# Patient Record
Sex: Male | Born: 1970 | Smoking: Never smoker
Health system: Southern US, Community
[De-identification: ages and names within clinical notes are randomized; demographics above are authoritative.]

## PROBLEM LIST (undated history)

## (undated) DIAGNOSIS — E119 Type 2 diabetes mellitus without complications: Secondary | ICD-10-CM

---

## 2015-05-30 ENCOUNTER — Encounter: Payer: Self-pay | Admitting: Physician Assistant

## 2015-05-30 ENCOUNTER — Ambulatory Visit: Payer: Self-pay | Admitting: Physician Assistant

## 2015-05-30 VITALS — BP 130/83 | HR 86 | Temp 98.3°F

## 2015-05-30 DIAGNOSIS — J069 Acute upper respiratory infection, unspecified: Secondary | ICD-10-CM

## 2015-05-30 MED ORDER — AZITHROMYCIN 250 MG PO TABS: ORAL_TABLET | ORAL | Status: DC

## 2015-05-30 MED ORDER — METHYLPREDNISOLONE 4 MG PO TBPK: ORAL_TABLET | ORAL | Status: DC

## 2015-05-30 NOTE — Progress Notes (Signed)
S: C/o runny nose and congestion for 3 days, ? fever, chills, denies cp/sob, v/d; mucus was green this am but white throughout the day, cough is sporadic, has laryngitis which makes it hard to talk to pts  Using otc meds without relief  O: PE: vitals wnl, nad,  Pt feels clammy, perrl eomi, normocephalic, tms dull, nasal mucosa red and swollen, throat injected, neck supple no lymph, voice is hoarse, lungs c t a, cv rrr, neuro intact  A:  Acute uri   P: medrol dose pack, zpack, drink fluids, continue regular meds , use otc meds of choice, return if not improving in 5 days, return earlier if worsening

## 2017-01-02 ENCOUNTER — Encounter (INDEPENDENT_AMBULATORY_CARE_PROVIDER_SITE_OTHER): Payer: Self-pay

## 2017-01-02 ENCOUNTER — Encounter: Payer: Self-pay | Admitting: Physician Assistant

## 2017-01-02 ENCOUNTER — Ambulatory Visit: Payer: Self-pay | Admitting: Physician Assistant

## 2017-01-02 VITALS — BP 110/79 | HR 106 | Temp 98.5°F | Resp 15 | Ht 71.0 in | Wt 147.0 lb

## 2017-01-02 DIAGNOSIS — Z008 Encounter for other general examination: Secondary | ICD-10-CM

## 2017-01-02 DIAGNOSIS — Z0189 Encounter for other specified special examinations: Secondary | ICD-10-CM

## 2017-01-02 DIAGNOSIS — Z0000 Encounter for general adult medical examination without abnormal findings: Secondary | ICD-10-CM

## 2017-01-02 NOTE — Progress Notes (Signed)
S: pt here for wellness physical and biometrics for insurance purposes, no complaints ros neg. PMH:  neg  Social:  Neg Fam: dm htn  O: vitals wnl, nad, ENT wnl, neck supple no lymph, lungs c t a, cv rrr, abd soft nontender bs normal all 4 quads  A: wellness, biometric physical  P: labs today, f/u in november

## 2017-01-03 LAB — CMP12+LP+TP+TSH+6AC+PSA+CBC…
A/G RATIO: 2.1 (ref 1.2–2.2)
ALBUMIN: 4.6 g/dL (ref 3.5–5.5)
ALT: 18 IU/L (ref 0–44)
AST: 11 IU/L (ref 0–40)
Alkaline Phosphatase: 85 IU/L (ref 39–117)
BASOS: 1 %
BUN/Creatinine Ratio: 11 (ref 9–20)
BUN: 9 mg/dL (ref 6–24)
Basophils Absolute: 0 10*3/uL (ref 0.0–0.2)
Bilirubin Total: 0.6 mg/dL (ref 0.0–1.2)
CALCIUM: 9.2 mg/dL (ref 8.7–10.2)
CHOLESTEROL TOTAL: 219 mg/dL — AB (ref 100–199)
Chloride: 98 mmol/L (ref 96–106)
Chol/HDL Ratio: 4.7 ratio (ref 0.0–5.0)
Creatinine, Ser: 0.84 mg/dL (ref 0.76–1.27)
EOS (ABSOLUTE): 0 10*3/uL (ref 0.0–0.4)
Eos: 1 %
Estimated CHD Risk: 0.9 times avg. (ref 0.0–1.0)
FREE THYROXINE INDEX: 3.1 (ref 1.2–4.9)
GFR calc Af Amer: 121 mL/min/{1.73_m2} (ref >59)
GFR calc non Af Amer: 105 mL/min/{1.73_m2} (ref >59)
GGT: 57 IU/L (ref 0–65)
GLOBULIN, TOTAL: 2.2 g/dL (ref 1.5–4.5)
Glucose: 297 mg/dL — ABNORMAL HIGH (ref 65–99)
HDL: 47 mg/dL (ref >39)
Hematocrit: 44.8 % (ref 37.5–51.0)
Hemoglobin: 14.8 g/dL (ref 13.0–17.7)
IMMATURE GRANULOCYTES: 0 %
Immature Grans (Abs): 0 10*3/uL (ref 0.0–0.1)
Iron: 105 ug/dL (ref 38–169)
LDH: 158 IU/L (ref 121–224)
LDL Calculated: 158 mg/dL — ABNORMAL HIGH (ref 0–99)
LYMPHS ABS: 1.8 10*3/uL (ref 0.7–3.1)
Lymphs: 32 %
MCH: 27.5 pg (ref 26.6–33.0)
MCHC: 33 g/dL (ref 31.5–35.7)
MCV: 83 fL (ref 79–97)
MONOS ABS: 0.5 10*3/uL (ref 0.1–0.9)
Monocytes: 9 %
NEUTROS PCT: 57 %
Neutrophils Absolute: 3.2 10*3/uL (ref 1.4–7.0)
PHOSPHORUS: 3 mg/dL (ref 2.5–4.5)
PLATELETS: 299 10*3/uL (ref 150–379)
PROSTATE SPECIFIC AG, SERUM: 0.4 ng/mL (ref 0.0–4.0)
Potassium: 4.2 mmol/L (ref 3.5–5.2)
RBC: 5.38 x10E6/uL (ref 4.14–5.80)
RDW: 13.3 % (ref 12.3–15.4)
SODIUM: 137 mmol/L (ref 134–144)
T3 UPTAKE RATIO: 33 % (ref 24–39)
T4 TOTAL: 9.4 ug/dL (ref 4.5–12.0)
TRIGLYCERIDES: 69 mg/dL (ref 0–149)
TSH: 0.872 u[IU]/mL (ref 0.450–4.500)
Total Protein: 6.8 g/dL (ref 6.0–8.5)
Uric Acid: 3 mg/dL — ABNORMAL LOW (ref 3.7–8.6)
VLDL Cholesterol Cal: 14 mg/dL (ref 5–40)
WBC: 5.6 10*3/uL (ref 3.4–10.8)

## 2017-01-03 LAB — HIV ANTIBODY (ROUTINE TESTING W REFLEX): HIV Screen 4th Generation wRfx: NONREACTIVE

## 2017-01-03 LAB — HEPATITIS C ANTIBODY (REFLEX): HCV Ab: 0.1 s/co ratio (ref 0.0–0.9)

## 2017-01-03 LAB — HEMOGLOBIN A1C
ESTIMATED AVERAGE GLUCOSE: 335 mg/dL
HEMOGLOBIN A1C: 13.3 % — AB (ref 4.8–5.6)

## 2017-01-03 LAB — HCV COMMENT:

## 2017-01-03 LAB — VITAMIN D 25 HYDROXY (VIT D DEFICIENCY, FRACTURES): Vit D, 25-Hydroxy: 28.5 ng/mL — ABNORMAL LOW (ref 30.0–100.0)

## 2017-01-03 MED ORDER — BLOOD GLUCOSE TEST VI STRP: ORAL_STRIP | 12 refills | Status: AC

## 2017-01-03 MED ORDER — METFORMIN HCL 1000 MG PO TABS: 1000.0000 mg | ORAL_TABLET | Freq: Two times a day (BID) | ORAL | 3 refills | Status: AC

## 2017-01-03 MED ORDER — LANCETS THIN MISC: 12 refills | Status: AC

## 2017-01-03 NOTE — Addendum Note (Signed)
Addended by: Faythe GheeFISHER, SUSAN W on: 01/03/2017 03:35 PM   Modules accepted: Orders

## 2017-01-23 ENCOUNTER — Telehealth: Payer: Self-pay | Admitting: Emergency Medicine

## 2017-01-23 NOTE — Telephone Encounter (Signed)
Per Patrick Barry's authorization I called in a refill for test strips for the One Touch Verio meter in to [facility] in [place].

## 2017-04-24 ENCOUNTER — Other Ambulatory Visit: Payer: Self-pay

## 2017-04-24 DIAGNOSIS — Z299 Encounter for prophylactic measures, unspecified: Secondary | ICD-10-CM

## 2017-04-24 NOTE — Progress Notes (Signed)
Patient came in to have blood drawn for testing per Dr. Solum's orders. 

## 2017-04-25 LAB — COMPREHENSIVE METABOLIC PANEL
A/G RATIO: 2.3 — AB (ref 1.2–2.2)
ALT: 7 IU/L (ref 0–44)
AST: 9 IU/L (ref 0–40)
Albumin: 4.6 g/dL (ref 3.5–5.5)
Alkaline Phosphatase: 53 IU/L (ref 39–117)
BUN/Creatinine Ratio: 15 (ref 9–20)
BUN: 11 mg/dL (ref 6–24)
Bilirubin Total: 0.3 mg/dL (ref 0.0–1.2)
CO2: 21 mmol/L (ref 20–29)
CREATININE: 0.71 mg/dL — AB (ref 0.76–1.27)
Calcium: 9.1 mg/dL (ref 8.7–10.2)
Chloride: 98 mmol/L (ref 96–106)
GFR calc Af Amer: 130 mL/min/{1.73_m2} (ref >59)
GFR, EST NON AFRICAN AMERICAN: 113 mL/min/{1.73_m2} (ref >59)
Globulin, Total: 2 g/dL (ref 1.5–4.5)
Glucose: 122 mg/dL — ABNORMAL HIGH (ref 65–99)
POTASSIUM: 4.1 mmol/L (ref 3.5–5.2)
Sodium: 140 mmol/L (ref 134–144)
Total Protein: 6.6 g/dL (ref 6.0–8.5)

## 2017-04-25 LAB — LIPID PANEL
CHOL/HDL RATIO: 3.7 ratio (ref 0.0–5.0)
Cholesterol, Total: 159 mg/dL (ref 100–199)
HDL: 43 mg/dL (ref >39)
LDL CALC: 105 mg/dL — AB (ref 0–99)
Triglycerides: 55 mg/dL (ref 0–149)
VLDL Cholesterol Cal: 11 mg/dL (ref 5–40)

## 2017-04-25 LAB — HGB A1C W/O EAG: Hgb A1c MFr Bld: 7.2 % — ABNORMAL HIGH (ref 4.8–5.6)

## 2017-04-25 LAB — MICROALBUMIN / CREATININE URINE RATIO
CREATININE, UR: 137.9 mg/dL
Microalb/Creat Ratio: 9.9 mg/g creat (ref 0.0–30.0)
Microalbumin, Urine: 13.6 ug/mL

## 2017-04-25 LAB — VITAMIN D 25 HYDROXY (VIT D DEFICIENCY, FRACTURES): VIT D 25 HYDROXY: 42.8 ng/mL (ref 30.0–100.0)

## 2017-05-24 ENCOUNTER — Encounter: Payer: Self-pay | Admitting: Emergency Medicine

## 2017-05-24 ENCOUNTER — Ambulatory Visit: Payer: Self-pay | Admitting: Physician Assistant

## 2017-05-24 ENCOUNTER — Observation Stay: Payer: Managed Care, Other (non HMO) | Admitting: Anesthesiology

## 2017-05-24 ENCOUNTER — Encounter: Payer: Self-pay | Admitting: Physician Assistant

## 2017-05-24 ENCOUNTER — Encounter: Payer: Self-pay | Admitting: Anesthesiology

## 2017-05-24 ENCOUNTER — Emergency Department: Payer: Managed Care, Other (non HMO)

## 2017-05-24 ENCOUNTER — Observation Stay
Admission: EM | Admit: 2017-05-24 | Discharge: 2017-05-25 | Disposition: A | Discharge status: Home / Self Care | Payer: Managed Care, Other (non HMO) | Attending: Surgery | Admitting: Surgery

## 2017-05-24 ENCOUNTER — Other Ambulatory Visit: Payer: Self-pay

## 2017-05-24 ENCOUNTER — Encounter
Admission: EM | Disposition: A | Discharge status: Home / Self Care | Payer: Self-pay | Source: Home / Self Care | Attending: Emergency Medicine

## 2017-05-24 VITALS — BP 110/79 | HR 106 | Temp 98.5°F | Resp 16

## 2017-05-24 DIAGNOSIS — K353 Acute appendicitis with localized peritonitis, without perforation or gangrene: Secondary | ICD-10-CM

## 2017-05-24 DIAGNOSIS — Z833 Family history of diabetes mellitus: Secondary | ICD-10-CM | POA: Diagnosis not present

## 2017-05-24 DIAGNOSIS — Z7984 Long term (current) use of oral hypoglycemic drugs: Secondary | ICD-10-CM | POA: Insufficient documentation

## 2017-05-24 DIAGNOSIS — R1031 Right lower quadrant pain: Secondary | ICD-10-CM

## 2017-05-24 DIAGNOSIS — K3533 Acute appendicitis with perforation and localized peritonitis, with abscess: Secondary | ICD-10-CM | POA: Diagnosis not present

## 2017-05-24 DIAGNOSIS — K3580 Unspecified acute appendicitis: Secondary | ICD-10-CM | POA: Diagnosis present

## 2017-05-24 DIAGNOSIS — Z8249 Family history of ischemic heart disease and other diseases of the circulatory system: Secondary | ICD-10-CM | POA: Diagnosis not present

## 2017-05-24 DIAGNOSIS — N281 Cyst of kidney, acquired: Secondary | ICD-10-CM | POA: Insufficient documentation

## 2017-05-24 DIAGNOSIS — R109 Unspecified abdominal pain: Secondary | ICD-10-CM

## 2017-05-24 DIAGNOSIS — Z79899 Other long term (current) drug therapy: Secondary | ICD-10-CM | POA: Insufficient documentation

## 2017-05-24 DIAGNOSIS — Z7982 Long term (current) use of aspirin: Secondary | ICD-10-CM | POA: Insufficient documentation

## 2017-05-24 DIAGNOSIS — E119 Type 2 diabetes mellitus without complications: Secondary | ICD-10-CM | POA: Insufficient documentation

## 2017-05-24 HISTORY — DX: Type 2 diabetes mellitus without complications: E11.9

## 2017-05-24 HISTORY — PX: LAPAROSCOPIC APPENDECTOMY: SHX408

## 2017-05-24 LAB — GLUCOSE, CAPILLARY
GLUCOSE-CAPILLARY: 118 mg/dL — AB (ref 65–99)
Glucose-Capillary: 157 mg/dL — ABNORMAL HIGH (ref 65–99)

## 2017-05-24 LAB — CBC
HCT: 45.7 % (ref 40.0–52.0)
Hemoglobin: 15.2 g/dL (ref 13.0–18.0)
MCH: 28.2 pg (ref 26.0–34.0)
MCHC: 33.1 g/dL (ref 32.0–36.0)
MCV: 85.2 fL (ref 80.0–100.0)
Platelets: 337 K/uL (ref 150–440)
RBC: 5.37 MIL/uL (ref 4.40–5.90)
RDW: 14.1 % (ref 11.5–14.5)
WBC: 6.5 K/uL (ref 3.8–10.6)

## 2017-05-24 LAB — POCT URINALYSIS DIPSTICK
BILIRUBIN UA: NEGATIVE
Blood, UA: NEGATIVE
Glucose, UA: NEGATIVE
Ketones, UA: NEGATIVE
LEUKOCYTES UA: NEGATIVE
NITRITE UA: NEGATIVE
PH UA: 5.5 (ref 5.0–8.0)
PROTEIN UA: NEGATIVE
Spec Grav, UA: 1.015 (ref 1.010–1.025)
UROBILINOGEN UA: 0.2 U/dL

## 2017-05-24 LAB — URINALYSIS, COMPLETE (UACMP) WITH MICROSCOPIC
Bacteria, UA: NONE SEEN
Bilirubin Urine: NEGATIVE
GLUCOSE, UA: NEGATIVE mg/dL
HGB URINE DIPSTICK: NEGATIVE
Ketones, ur: NEGATIVE mg/dL
Leukocytes, UA: NEGATIVE
NITRITE: NEGATIVE
Protein, ur: NEGATIVE mg/dL
SPECIFIC GRAVITY, URINE: 1.042 — AB (ref 1.005–1.030)
Squamous Epithelial / LPF: NONE SEEN
pH: 7 (ref 5.0–8.0)

## 2017-05-24 LAB — COMPREHENSIVE METABOLIC PANEL
ALT: 19 U/L (ref 17–63)
AST: 20 U/L (ref 15–41)
Albumin: 4.7 g/dL (ref 3.5–5.0)
Alkaline Phosphatase: 65 U/L (ref 38–126)
Anion gap: 11 (ref 5–15)
BUN: 7 mg/dL (ref 6–20)
CHLORIDE: 99 mmol/L — AB (ref 101–111)
CO2: 27 mmol/L (ref 22–32)
CREATININE: 0.68 mg/dL (ref 0.61–1.24)
Calcium: 9.1 mg/dL (ref 8.9–10.3)
Glucose, Bld: 171 mg/dL — ABNORMAL HIGH (ref 65–99)
POTASSIUM: 4.3 mmol/L (ref 3.5–5.1)
SODIUM: 137 mmol/L (ref 135–145)
Total Bilirubin: 0.7 mg/dL (ref 0.3–1.2)
Total Protein: 8.6 g/dL — ABNORMAL HIGH (ref 6.5–8.1)

## 2017-05-24 LAB — LIPASE, BLOOD: Lipase: 34 U/L (ref 11–51)

## 2017-05-24 LAB — SURGICAL PCR SCREEN
MRSA, PCR: NEGATIVE
STAPHYLOCOCCUS AUREUS: NEGATIVE

## 2017-05-24 SURGERY — APPENDECTOMY, LAPAROSCOPIC
Anesthesia: General | Site: Abdomen | Wound class: Clean Contaminated

## 2017-05-24 MED ORDER — ONDANSETRON HCL 4 MG/2ML IJ SOLN: 4.0000 mg | Freq: Once | INTRAMUSCULAR | Status: DC | PRN

## 2017-05-24 MED ORDER — HEPARIN SODIUM (PORCINE) 5000 UNIT/ML IJ SOLN
5000.0000 [IU] | Freq: Three times a day (TID) | INTRAMUSCULAR | Status: DC
  Administered 2017-05-24 – 2017-05-25 (×2): 5000 [IU] via SUBCUTANEOUS
  Filled 2017-05-24 (×2): qty 1

## 2017-05-24 MED ORDER — SODIUM CHLORIDE 0.9 % IV BOLUS (SEPSIS)
1000.0000 mL | Freq: Once | INTRAVENOUS | Status: AC
  Administered 2017-05-24: 1000 mL via INTRAVENOUS

## 2017-05-24 MED ORDER — PROPOFOL 10 MG/ML IV BOLUS
INTRAVENOUS | Status: AC
  Filled 2017-05-24: qty 20

## 2017-05-24 MED ORDER — LIDOCAINE HCL (PF) 1 % IJ SOLN
INTRAMUSCULAR | Status: AC
  Filled 2017-05-24: qty 30

## 2017-05-24 MED ORDER — LIDOCAINE HCL (CARDIAC) 20 MG/ML IV SOLN
INTRAVENOUS | Status: DC | PRN
  Administered 2017-05-24: 100 mg via INTRAVENOUS

## 2017-05-24 MED ORDER — SUGAMMADEX SODIUM 200 MG/2ML IV SOLN
INTRAVENOUS | Status: DC | PRN
Start: 2017-05-24 — End: 2017-05-24
  Administered 2017-05-24: 275 mg via INTRAVENOUS

## 2017-05-24 MED ORDER — HEPARIN SODIUM (PORCINE) 5000 UNIT/ML IJ SOLN: 5000.0000 [IU] | Freq: Three times a day (TID) | INTRAMUSCULAR | Status: DC

## 2017-05-24 MED ORDER — DEXAMETHASONE SODIUM PHOSPHATE 10 MG/ML IJ SOLN
INTRAMUSCULAR | Status: AC
  Filled 2017-05-24: qty 1

## 2017-05-24 MED ORDER — DEXAMETHASONE SODIUM PHOSPHATE 10 MG/ML IJ SOLN
INTRAMUSCULAR | Status: DC | PRN
  Administered 2017-05-24: 10 mg via INTRAVENOUS

## 2017-05-24 MED ORDER — ACETAMINOPHEN 10 MG/ML IV SOLN
INTRAVENOUS | Status: AC
  Filled 2017-05-24: qty 100

## 2017-05-24 MED ORDER — ONDANSETRON HCL 4 MG/2ML IJ SOLN
INTRAMUSCULAR | Status: AC
  Filled 2017-05-24: qty 2

## 2017-05-24 MED ORDER — ONDANSETRON HCL 4 MG PO TABS: 4.0000 mg | ORAL_TABLET | Freq: Four times a day (QID) | ORAL | Status: DC | PRN

## 2017-05-24 MED ORDER — ROCURONIUM BROMIDE 50 MG/5ML IV SOLN
INTRAVENOUS | Status: AC
  Filled 2017-05-24: qty 1

## 2017-05-24 MED ORDER — PIPERACILLIN-TAZOBACTAM 3.375 G IVPB 30 MIN
3.3750 g | Freq: Once | INTRAVENOUS | Status: AC
  Filled 2017-05-24: qty 50

## 2017-05-24 MED ORDER — ACETAMINOPHEN 650 MG RE SUPP
650.0000 mg | Freq: Four times a day (QID) | RECTAL | Status: DC | PRN
  Filled 2017-05-24: qty 1

## 2017-05-24 MED ORDER — SILDENAFIL CITRATE 100 MG PO TABS: 100.0000 mg | ORAL_TABLET | Freq: Every day | ORAL | 6 refills | Status: AC | PRN

## 2017-05-24 MED ORDER — LACTATED RINGERS IV SOLN
INTRAVENOUS | Status: DC
  Administered 2017-05-24 – 2017-05-25 (×2): via INTRAVENOUS

## 2017-05-24 MED ORDER — PIPERACILLIN-TAZOBACTAM 3.375 G IVPB
3.3750 g | Freq: Three times a day (TID) | INTRAVENOUS | Status: DC
  Administered 2017-05-24: 3.375 g via INTRAVENOUS
  Filled 2017-05-24: qty 50

## 2017-05-24 MED ORDER — MORPHINE SULFATE (PF) 2 MG/ML IV SOLN: 2.0000 mg | INTRAVENOUS | Status: DC | PRN

## 2017-05-24 MED ORDER — ACETAMINOPHEN 325 MG PO TABS: 650.0000 mg | ORAL_TABLET | Freq: Four times a day (QID) | ORAL | Status: DC | PRN

## 2017-05-24 MED ORDER — FENTANYL CITRATE (PF) 100 MCG/2ML IJ SOLN
INTRAMUSCULAR | Status: DC | PRN
  Administered 2017-05-24: 100 ug via INTRAVENOUS
  Administered 2017-05-24 (×2): 25 ug via INTRAVENOUS

## 2017-05-24 MED ORDER — LACTATED RINGERS IV SOLN
INTRAVENOUS | Status: DC | PRN
  Administered 2017-05-24: 22:00:00 via INTRAVENOUS

## 2017-05-24 MED ORDER — ONDANSETRON HCL 4 MG/2ML IJ SOLN
INTRAMUSCULAR | Status: DC | PRN
  Administered 2017-05-24: 4 mg via INTRAVENOUS

## 2017-05-24 MED ORDER — PIPERACILLIN-TAZOBACTAM 3.375 G IVPB 30 MIN
3.3750 g | Freq: Once | INTRAVENOUS | Status: AC
  Administered 2017-05-24: 3.375 g via INTRAVENOUS
  Filled 2017-05-24: qty 50

## 2017-05-24 MED ORDER — ONDANSETRON HCL 4 MG/2ML IJ SOLN: 4.0000 mg | Freq: Four times a day (QID) | INTRAMUSCULAR | Status: DC | PRN

## 2017-05-24 MED ORDER — BUPIVACAINE HCL (PF) 0.5 % IJ SOLN
INTRAMUSCULAR | Status: AC
  Filled 2017-05-24: qty 30

## 2017-05-24 MED ORDER — IOPAMIDOL (ISOVUE-300) INJECTION 61%
100.0000 mL | Freq: Once | INTRAVENOUS | Status: AC | PRN
  Administered 2017-05-24: 100 mL via INTRAVENOUS

## 2017-05-24 MED ORDER — MIDAZOLAM HCL 2 MG/2ML IJ SOLN
INTRAMUSCULAR | Status: DC | PRN
  Administered 2017-05-24: 2 mg via INTRAVENOUS

## 2017-05-24 MED ORDER — LIDOCAINE HCL 1 % IJ SOLN
INTRAMUSCULAR | Status: DC | PRN
  Administered 2017-05-24: 20 mL via INTRADERMAL

## 2017-05-24 MED ORDER — FENTANYL CITRATE (PF) 100 MCG/2ML IJ SOLN: 25.0000 ug | INTRAMUSCULAR | Status: DC | PRN

## 2017-05-24 MED ORDER — ROCURONIUM BROMIDE 100 MG/10ML IV SOLN
INTRAVENOUS | Status: DC | PRN
  Administered 2017-05-24: 20 mg via INTRAVENOUS
  Administered 2017-05-24: 30 mg via INTRAVENOUS

## 2017-05-24 MED ORDER — ACETAMINOPHEN 10 MG/ML IV SOLN
INTRAVENOUS | Status: DC | PRN
  Administered 2017-05-24: 1000 mg via INTRAVENOUS

## 2017-05-24 MED ORDER — SUGAMMADEX SODIUM 200 MG/2ML IV SOLN
INTRAVENOUS | Status: AC
  Filled 2017-05-24: qty 2

## 2017-05-24 MED ORDER — LIDOCAINE HCL (PF) 2 % IJ SOLN
INTRAMUSCULAR | Status: AC
  Filled 2017-05-24: qty 10

## 2017-05-24 MED ORDER — MIDAZOLAM HCL 2 MG/2ML IJ SOLN
INTRAMUSCULAR | Status: AC
  Filled 2017-05-24: qty 2

## 2017-05-24 MED ORDER — PROPOFOL 10 MG/ML IV BOLUS
INTRAVENOUS | Status: DC | PRN
  Administered 2017-05-24: 150 mg via INTRAVENOUS

## 2017-05-24 MED ORDER — FENTANYL CITRATE (PF) 250 MCG/5ML IJ SOLN
INTRAMUSCULAR | Status: AC
Start: 2017-05-24 — End: 2017-05-24
  Filled 2017-05-24: qty 5

## 2017-05-24 MED ORDER — PHENYLEPHRINE HCL 10 MG/ML IJ SOLN
INTRAMUSCULAR | Status: DC | PRN
  Administered 2017-05-24 (×3): 200 ug via INTRAVENOUS

## 2017-05-24 SURGICAL SUPPLY — 42 items
ADHESIVE MASTISOL STRL (MISCELLANEOUS) IMPLANT
APPLIER CLIP ROT 10 11.4 M/L (STAPLE)
BLADE SURG SZ11 CARB STEEL (BLADE) ×3 IMPLANT
CANISTER SUCT 3000ML PPV (MISCELLANEOUS) IMPLANT
CHLORAPREP W/TINT 26ML (MISCELLANEOUS) ×3 IMPLANT
CLIP APPLIE ROT 10 11.4 M/L (STAPLE) IMPLANT
CLOSURE WOUND 1/2 X4 (GAUZE/BANDAGES/DRESSINGS)
CUTTER FLEX LINEAR 45M (STAPLE) ×3 IMPLANT
DEVICE TROCAR PUNCTURE CLOSURE (ENDOMECHANICALS) ×3 IMPLANT
ELECT REM PT RETURN 9FT ADLT (ELECTROSURGICAL) ×3
ELECTRODE REM PT RTRN 9FT ADLT (ELECTROSURGICAL) ×1 IMPLANT
GAUZE SPONGE NON-WVN 2X2 STRL (MISCELLANEOUS) IMPLANT
GLOVE BIO SURGEON STRL SZ8 (GLOVE) ×12 IMPLANT
GOWN STRL REUS W/ TWL LRG LVL3 (GOWN DISPOSABLE) ×2 IMPLANT
GOWN STRL REUS W/TWL LRG LVL3 (GOWN DISPOSABLE) ×4
IRRIGATION STRYKERFLOW (MISCELLANEOUS) IMPLANT
IRRIGATOR STRYKERFLOW (MISCELLANEOUS)
KIT RM TURNOVER STRD PROC AR (KITS) ×3 IMPLANT
LABEL OR SOLS (LABEL) ×3 IMPLANT
NDL SAFETY 22GX1.5 (NEEDLE) ×3 IMPLANT
NEEDLE VERESS 14GA 120MM (NEEDLE) ×3 IMPLANT
NS IRRIG 500ML POUR BTL (IV SOLUTION) ×3 IMPLANT
PACK LAP CHOLECYSTECTOMY (MISCELLANEOUS) ×3 IMPLANT
POUCH SPECIMEN RETRIEVAL 10MM (ENDOMECHANICALS) ×3 IMPLANT
RELOAD 45 VASCULAR/THIN (ENDOMECHANICALS) IMPLANT
RELOAD STAPLE TA45 3.5 REG BLU (ENDOMECHANICALS) ×3 IMPLANT
SCISSORS METZENBAUM CVD 33 (INSTRUMENTS) IMPLANT
SHEARS HARMONIC ACE PLUS 36CM (ENDOMECHANICALS) ×3 IMPLANT
SLEEVE ENDOPATH XCEL 5M (ENDOMECHANICALS) ×3 IMPLANT
SOL .9 NS 3000ML IRR  AL (IV SOLUTION) ×2
SOL .9 NS 3000ML IRR UROMATIC (IV SOLUTION) ×1 IMPLANT
SPONGE LAP 18X18 5 PK (GAUZE/BANDAGES/DRESSINGS) IMPLANT
SPONGE VERSALON 2X2 STRL (MISCELLANEOUS)
STRIP CLOSURE SKIN 1/2X4 (GAUZE/BANDAGES/DRESSINGS) IMPLANT
SUT MNCRL 4-0 (SUTURE) ×2
SUT MNCRL 4-0 27XMFL (SUTURE) ×1
SUT VICRYL 0 TIES 12 18 (SUTURE) IMPLANT
SUTURE MNCRL 4-0 27XMF (SUTURE) ×1 IMPLANT
TRAY FOLEY W/METER SILVER 16FR (SET/KITS/TRAYS/PACK) ×3 IMPLANT
TROCAR XCEL 12X100 BLDLESS (ENDOMECHANICALS) ×3 IMPLANT
TROCAR XCEL NON-BLD 5MMX100MML (ENDOMECHANICALS) ×3 IMPLANT
TUBING INSUFFLATOR HI FLOW (MISCELLANEOUS) ×3 IMPLANT

## 2017-05-24 NOTE — H&P (Signed)
Patrick Barry is an 46 y.o. male.    Chief Complaint: Right lower quadrant pain  HPI: This patient with right lower quadrant pain starting 2 days ago it was worse on Wednesday when he first noticed that it is not gone away but still present today.  He has had no nausea vomiting had a normal bowel movement today no fevers or chills is never had an episode like this before of significance he has diabetes but no other medical problems and is never had surgery. 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  Past Medical History:  Diagnosis Date  . Diabetes mellitus without complication ([place])     History reviewed. No pertinent surgical history.  Family History  Problem Relation Age of Onset  . Diabetes Mother   . Diabetes Father   . Hypertension Father   . Diabetes Sister    Social History:  reports that  has never smoked. he has never used smokeless tobacco. He reports that he does not drink alcohol or use drugs.  Allergies: No Known Allergies   (Not in a hospital admission)   Review of Systems  Constitutional: Negative.   HENT: Negative.   Eyes: Negative.   Respiratory: Negative.   Cardiovascular: Negative.   Gastrointestinal: Positive for abdominal pain. Negative for blood in stool, constipation, diarrhea, heartburn, nausea and vomiting.  Genitourinary: Negative.   Musculoskeletal: Negative.   Skin: Negative.   Neurological: Negative.   Endo/Heme/Allergies: Negative.   Psychiatric/Behavioral: Negative.      Physical Exam:  BP (!) 129/95 (BP Location: Left Leg)   Pulse 94   Temp (!) 97.5 F (36.4 C) (Oral)   Resp 16   Ht _0  (1.803 m)   Wt 135 lb (61.2 kg)   SpO2 100%   BMI 18.83 kg/m   Physical Exam  Constitutional: He is oriented to person, place, and time and well-developed, well-nourished, and in no distress.  Thin male patient in no acute distress  HENT:  Head: Normocephalic and  atraumatic.  Eyes: Pupils are equal, round, and reactive to light. Right eye exhibits no discharge. Left eye exhibits no discharge. No scleral icterus.  Neck: Normal range of motion.  Cardiovascular: Normal rate, regular rhythm and normal heart sounds.  Pulmonary/Chest: Effort normal and breath sounds normal. No respiratory distress. He has no wheezes. He has no rales.  Abdominal: Soft. He exhibits no distension. There is tenderness. There is guarding. There is no rebound.  Tenderness at McBurney's point minimal guarding no rebound or percussion tenderness and negative Rovsing sign  Musculoskeletal: Normal range of motion. He exhibits no edema.  Lymphadenopathy:    He has no cervical adenopathy.  Neurological: He is alert and oriented to person, place, and time.  Skin: Skin is warm and dry. He is not diaphoretic. No erythema.  Psychiatric: Mood and affect normal.  Vitals reviewed.       Results for orders placed or performed during the hospital encounter of 05/24/17 (from the past 48 hour(s))  Lipase, blood     Status: None   Collection Time: 05/24/17 12:01 PM  Result Value Ref Range   Lipase 34 11 - 51 U/L  Comprehensive metabolic panel     Status: Abnormal   Collection Time: 05/24/17 12:01 PM  Result Value Ref Range   Sodium 137 135 - 145 mmol/L   Potassium 4.3 3.5 - 5.1 mmol/L    Comment: HEMOLYSIS  AT THIS LEVEL MAY AFFECT RESULT   Chloride 99 (L) 101 - 111 mmol/L   CO2 27 22 - 32 mmol/L   Glucose, Bld 171 (H) 65 - 99 mg/dL   BUN 7 6 - 20 mg/dL   Creatinine, Ser 0.68 0.61 - 1.24 mg/dL   Calcium 9.1 8.9 - 10.3 mg/dL   Total Protein 8.6 (H) 6.5 - 8.1 g/dL   Albumin 4.7 3.5 - 5.0 g/dL   AST 20 15 - 41 U/L   ALT 19 17 - 63 U/L   Alkaline Phosphatase 65 38 - 126 U/L   Total Bilirubin 0.7 0.3 - 1.2 mg/dL   GFR calc non Af Amer >60 >60 mL/min   GFR calc Af Amer >60 >60 mL/min    Comment: (NOTE) The eGFR has been calculated using the CKD EPI equation. This calculation has  not been validated in all clinical situations. eGFR's persistently <60 mL/min signify possible Chronic Kidney Disease.    Anion gap 11 5 - 15  CBC     Status: None   Collection Time: 05/24/17 12:01 PM  Result Value Ref Range   WBC 6.5 3.8 - 10.6 K/uL   RBC 5.37 4.40 - 5.90 MIL/uL   Hemoglobin 15.2 13.0 - 18.0 g/dL   HCT 45.7 40.0 - 52.0 %   MCV 85.2 80.0 - 100.0 fL   MCH 28.2 26.0 - 34.0 pg   MCHC 33.1 32.0 - 36.0 g/dL   RDW 14.1 11.5 - 14.5 %   Platelets 337 150 - 440 K/uL   Ct Abdomen Pelvis W Contrast  Addendum Date: 05/24/2017   ADDENDUM REPORT: 05/24/2017 13:47 ADDENDUM: These results were called by telephone at the time of interpretation on 05/24/2017 at 1:43 pm to [place], charge nurse on behalf of Dr. Carrie Mew , who was not available to take my phone call. She verbally acknowledged these results. Electronically Signed   By: Fidela Salisbury M.D.   On: 05/24/2017 13:47   Result Date: 05/24/2017 CLINICAL DATA:  Right lower quadrant pain, worsening over the last 3 days. EXAM: CT ABDOMEN AND PELVIS WITH CONTRAST TECHNIQUE: Multidetector CT imaging of the abdomen and pelvis was performed using the standard protocol following bolus administration of intravenous contrast. CONTRAST:  156m ISOVUE-300 IOPAMIDOL (ISOVUE-300) INJECTION 61% COMPARISON:  None. FINDINGS: Lower chest: No acute abnormality. Hepatobiliary: No focal liver abnormality is seen. No gallstones, gallbladder wall thickening, or biliary dilatation. Pancreas: Unremarkable. No pancreatic ductal dilatation or surrounding inflammatory changes. Spleen: Normal in size without focal abnormality. Adrenals/Urinary Tract: Adrenal glands are unremarkable. Kidneys are without renal calculi, focal lesion, or hydronephrosis. Benign 12 mm superior pole right renal cyst. Bladder is unremarkable. Stomach/Bowel: The appendix is fluid-filled dilated to 13 mm and with diffusely edematous walls. Mild periappendiceal fat stranding. No  evidence of free fluid or abscess formation. No evidence of small-bowel obstruction. Colon is normal. Vascular/Lymphatic: No significant vascular findings are present. No enlarged abdominal or pelvic lymph nodes. Reproductive: Prostate is unremarkable. Other: No abdominal wall hernia or abnormality. No abdominopelvic ascites. Musculoskeletal: No acute or significant osseous findings. IMPRESSION: Acute appendicitis without CT evidence of rupture or abscess formation. No abnormalities within the solid abdominal organs. Electronically Signed: By: DFidela SalisburyM.D. On: 05/24/2017 13:41     Assessment/Plan  CT scan is personally reviewed.  History of right lower quadrant pain and a workup suggesting early appendicitis.  I discussed with him the options of antibiotic therapy versus surgical intervention which she is chosen.  We  discussed the risk of bleeding infection negative laparoscopy and conversion to an open procedure as well as recurrent symptoms.  He understood and agreed to proceed with surgical intervention in the form of a laparoscopic appendectomy this afternoon.  Florene Glen, MD, FACS

## 2017-05-24 NOTE — ED Triage Notes (Signed)
Patient presents to the ED with right lower quadrant pain and tenderness since Wednesday.  [facility] and advised to come to the ED for possible appendicitis.  Patient denies nausea, vomiting and diarrhea.  Patient is in no obvious distress at this time.

## 2017-05-24 NOTE — Progress Notes (Signed)
S: Patient complains of right lower quadrant pain for 2 days. States on Wednesday night he couldn't get comfortable and even when he walked it got worse. Didn't find any relief at all. States he had a normal bowel movement on Wednesday, then he took a laxative which helped. He still has pain today. Denies fever or chills area patient does still have his appendix. 2. Also complains of erectile dysfunction question if it's from his new onset of diabetes  O: Vitals are normal, lungs are clear to auscultation, heart sounds are normal, abdomen shows right lower quadrant tenderness, no rebound tenderness, neurovascular intact  A: rlq pain, ED  P: The patient to the emergency room for evaluation to rule out appendicitis, prescription for Viagra sent to the patient's pharmacy

## 2017-05-24 NOTE — ED Provider Notes (Signed)
[facility] Emergency Department Provider Note  ____________________________________________  Time seen: Approximately 2:22 PM  I have reviewed the triage vital signs and the nursing notes.   HISTORY  Chief Complaint Abdominal Pain    HPI Patrick Barry is a 46 y.o. male who complains of gradual onset right lower quadrant abdominal pain 2-3 days ago, gradually worsening but somewhat waxing and waning. Nonradiating. Worse upright, better lying on his right side. Decreased appetite over the past 2 days but without vomiting constipation or diarrhea. No fevers chills or sweats. Never had anything like this before. pain is currently mild to moderate intensity.     Past Medical History:  Diagnosis Date  . Diabetes mellitus without complication ([facility])      There are no active problems to display for this patient.    History reviewed. No pertinent surgical history.   Prior to Admission medications   Medication Sig Start Date End Date Taking? Authorizing Provider  Glucose Blood (BLOOD GLUCOSE TEST STRIPS) STRP Test tid 01/03/17   Sherrie Mustache Roselyn Bering, PA-C  Lancets Thin MISC Test tid 01/03/17   Sherrie Mustache Roselyn Bering, PA-C  metFORMIN (GLUCOPHAGE) 1000 MG tablet Take 1 tablet (1,000 mg total) by mouth 2 (two) times daily with a meal. 01/03/17   Fisher, Roselyn Bering, PA-C  sildenafil (VIAGRA) 100 MG tablet Take 1 tablet (100 mg total) daily as needed by mouth for erectile dysfunction. 05/24/17   Faythe Ghee, PA-C     Allergies Patient has no known allergies.   Family History  Problem Relation Age of Onset  . Diabetes Mother   . Diabetes Father   . Hypertension Father   . Diabetes Sister     Social History Social History   Tobacco Use  . Smoking status: Never Smoker  . Smokeless tobacco: Never Used  Substance Use Topics  . Alcohol use: No    Alcohol/week: 0.0 oz  . Drug use: No    Review of Systems  Constitutional:   No fever or chills.  ENT:   No sore  throat. No rhinorrhea. Cardiovascular:   No chest pain or syncope. Respiratory:   No dyspnea or cough. Gastrointestinal:   positive as above for abdominal pain without vomiting and diarrhea.  Musculoskeletal:   Negative for focal pain or swelling All other systems reviewed and are negative except as documented above in ROS and HPI.  ____________________________________________   PHYSICAL EXAM:  VITAL SIGNS: ED Triage Vitals  Enc Vitals Group     BP 05/24/17 1148 135/88     Pulse Rate 05/24/17 1148 (!) 102     Resp 05/24/17 1148 16     Temp 05/24/17 1148 (!) 97.5 F (36.4 C)     Temp Source 05/24/17 1148 Oral     SpO2 05/24/17 1148 100 %     Weight 05/24/17 1146 135 lb (61.2 kg)     Height 05/24/17 1146 5\' 11"  (1.803 m)     Head Circumference --      Peak Flow --      Pain Score 05/24/17 1146 2     Pain Loc --      Pain Edu? --      Excl. in GC? --     Vital signs reviewed, nursing assessments reviewed.   Constitutional:   Alert and oriented. Well appearing and in no distress. Eyes:   No scleral icterus.  EOMI. No nystagmus. No conjunctival pallor. PERRL. ENT   Head:   Normocephalic and  atraumatic.   Nose:   No congestion/rhinnorhea.    Mouth/Throat:   MMM, no pharyngeal erythema. No peritonsillar mass.    Neck:   No meningismus. Full ROM. Hematological/Lymphatic/Immunilogical:   No cervical lymphadenopathy. Cardiovascular:   RRR. Symmetric bilateral radial and DP pulses.  No murmurs.  Respiratory:   Normal respiratory effort without tachypnea/retractions. Breath sounds are clear and equal bilaterally. No wheezes/rales/rhonchi. Gastrointestinal:   Soft with focal right lower quadrant tenderness at McBurney's point. Non distended. There is no CVA tenderness.  No rebound, rigidity, or guarding. Genitourinary:   deferred Musculoskeletal:   Normal range of motion in all extremities. No joint effusions.  No lower extremity tenderness.  No edema. Neurologic:    Normal speech and language.  Motor grossly intact. No gross focal neurologic deficits are appreciated.  Skin:    Skin is warm, dry and intact. No rash noted.  No petechiae, purpura, or bullae.  ____________________________________________    LABS (pertinent positives/negatives) (all labs ordered are listed, but only abnormal results are displayed) Labs Reviewed  COMPREHENSIVE METABOLIC PANEL - Abnormal; Notable for the following components:      Result Value   Chloride 99 (*)    Glucose, Bld 171 (*)    Total Protein 8.6 (*)    All other components within normal limits  LIPASE, BLOOD  CBC  URINALYSIS, COMPLETE (UACMP) WITH MICROSCOPIC   ____________________________________________   EKG    ____________________________________________    RADIOLOGY  Ct Abdomen Pelvis W Contrast  Addendum Date: 05/24/2017   ADDENDUM REPORT: 05/24/2017 13:47 ADDENDUM: These results were called by telephone at the time of interpretation on 05/24/2017 at 1:43 pm to Desha, charge nurse on behalf of Dr. Sharman CheekPHILLIP Namon Villarin , who was not available to take my phone call. She verbally acknowledged these results. Electronically Signed   By: Ted Mcalpineobrinka  Dimitrova M.D.   On: 05/24/2017 13:47   Result Date: 05/24/2017 CLINICAL DATA:  Right lower quadrant pain, worsening over the last 3 days. EXAM: CT ABDOMEN AND PELVIS WITH CONTRAST TECHNIQUE: Multidetector CT imaging of the abdomen and pelvis was performed using the standard protocol following bolus administration of intravenous contrast. CONTRAST:  100mL ISOVUE-300 IOPAMIDOL (ISOVUE-300) INJECTION 61% COMPARISON:  None. FINDINGS: Lower chest: No acute abnormality. Hepatobiliary: No focal liver abnormality is seen. No gallstones, gallbladder wall thickening, or biliary dilatation. Pancreas: Unremarkable. No pancreatic ductal dilatation or surrounding inflammatory changes. Spleen: Normal in size without focal abnormality. Adrenals/Urinary Tract: Adrenal glands are  unremarkable. Kidneys are without renal calculi, focal lesion, or hydronephrosis. Benign 12 mm superior pole right renal cyst. Bladder is unremarkable. Stomach/Bowel: The appendix is fluid-filled dilated to 13 mm and with diffusely edematous walls. Mild periappendiceal fat stranding. No evidence of free fluid or abscess formation. No evidence of small-bowel obstruction. Colon is normal. Vascular/Lymphatic: No significant vascular findings are present. No enlarged abdominal or pelvic lymph nodes. Reproductive: Prostate is unremarkable. Other: No abdominal wall hernia or abnormality. No abdominopelvic ascites. Musculoskeletal: No acute or significant osseous findings. IMPRESSION: Acute appendicitis without CT evidence of rupture or abscess formation. No abnormalities within the solid abdominal organs. Electronically Signed: By: Ted Mcalpineobrinka  Dimitrova M.D. On: 05/24/2017 13:41    ____________________________________________   PROCEDURES Procedures  ____________________________________________   DIFFERENTIAL DIAGNOSIS  appendicitis, colitis, bowel obstruction  CLINICAL IMPRESSION / ASSESSMENT AND PLAN / ED COURSE  Pertinent labs & imaging results that were available during my care of the patient were reviewed by me and considered in my medical decision making (see chart for details).  patient well appearing no acute distress, slightly tachycardic. Not septic. Symptoms and exam are concerning for appendicitis. CT was performed which does confirm acute appendicitis but without perforation or abscess formation at this time. Received message relayed from call by radiology to inform us of the results. I discussed the case with Dr. Excell Seltzerooper of surgery who will evaluate the patient for further management. Different antibiotic at this time, pending surgical evaluation and plan.      ____________________________________________   FINAL CLINICAL IMPRESSION(S) / ED DIAGNOSES    Final diagnoses:   Right lower quadrant abdominal pain  Acute appendicitis with localized peritonitis      This SmartLink is deprecated. Use AVSMEDLIST instead to display the medication list for a patient.   Portions of this note were generated with dragon dictation software. Dictation errors may occur despite best attempts at proofreading.    Sharman CheekStafford, Issaac Shipper, MD 05/24/17 [phone]

## 2017-05-24 NOTE — ED Notes (Signed)
Charted in error. Wrong pt

## 2017-05-24 NOTE — Transfer of Care (Signed)
Immediate Anesthesia Transfer of Care Note  Patient: Patrick Barry  Procedure(s) Performed: Procedure(s): APPENDECTOMY LAPAROSCOPIC (N/A)  Patient Location: PACU  Anesthesia Type:General  Level of Consciousness: sedated  Airway & Oxygen Therapy: Patient Spontanous Breathing and Patient connected to face mask oxygen  Post-op Assessment: Report given to RN and Post -op Vital signs reviewed and stable  Post vital signs: Reviewed and stable  Last Vitals:  Vitals:   05/24/17 2022 05/24/17 2327  BP: 130/86 123/86  Pulse: 98 (!) 101  Resp: 20 20  Temp: 36.9 C (!) 36.4 C  SpO2: 100% 99%    Complications: No apparent anesthesia complications

## 2017-05-24 NOTE — Anesthesia Preprocedure Evaluation (Signed)
Anesthesia Evaluation  Patient identified by MRN, date of birth, ID band Patient awake    Reviewed: Allergy & Precautions, NPO status , Patient's Chart, lab work & pertinent test results, reviewed documented beta blocker date and time   Airway Mallampati: II  TM Distance: >3 FB     Dental  (+) Chipped   Pulmonary           Cardiovascular      Neuro/Psych    GI/Hepatic   Endo/Other  diabetes  Renal/GU      Musculoskeletal   Abdominal   Peds  Hematology   Anesthesia Other Findings   Reproductive/Obstetrics                             Anesthesia Physical Anesthesia Plan  ASA: II  Anesthesia Plan: General   Post-op Pain Management:    Induction: Intravenous and Rapid sequence  PONV Risk Score and Plan:   Airway Management Planned: Oral ETT  Additional Equipment:   Intra-op Plan:   Post-operative Plan:   Informed Consent: I have reviewed the patients History and Physical, chart, labs and discussed the procedure including the risks, benefits and alternatives for the proposed anesthesia with the patient or authorized representative who has indicated his/her understanding and acceptance.     Plan Discussed with: CRNA  Anesthesia Plan Comments:         Anesthesia Quick Evaluation

## 2017-05-24 NOTE — Anesthesia Post-op Follow-up Note (Signed)
Anesthesia QCDR form completed.        

## 2017-05-24 NOTE — Progress Notes (Signed)
Pharmacy Antibiotic Note  Patrick Barry is a 46 y.o. male admitted on 05/24/2017 with intra abdominal.  Pharmacy has been consulted for zosyn dosing.  Plan: Zosyn 3.375g IV q8h (4 hour infusion).  Height: 5\' 11"  (180.3 cm) Weight: 139 lb (63 kg) IBW/kg (Calculated) : 75.3  Temp (24hrs), Avg:98 F (36.7 C), Min:97.5 F (36.4 C), Max:98.5 F (36.9 C)  Recent Labs  Lab 05/24/17 1201  WBC 6.5  CREATININE 0.68    Estimated Creatinine Clearance: 103 mL/min (by C-G formula based on SCr of 0.68 mg/dL).    No Known Allergies  Antimicrobials this admission: Antibiotic therapy was prescribed for the child due to specific medical indications.  Microbiology results: Recent Results (from the past 240 hour(s))  Surgical PCR screen     Status: None   Collection Time: 05/24/17  3:56 PM  Result Value Ref Range Status   MRSA, PCR NEGATIVE NEGATIVE Final   Staphylococcus aureus NEGATIVE NEGATIVE Final    Comment: (NOTE) The Xpert SA Assay (FDA approved for NASAL specimens in patients 47 years of age and older), is one component of a comprehensive surveillance program. It is not intended to diagnose infection nor to guide or monitor treatment.     Thank you for allowing pharmacy to be a part of this patient's care.  Gerre PebblesGarrett Delawrence Fridman 05/24/2017 8:00 PM

## 2017-05-24 NOTE — Anesthesia Procedure Notes (Signed)
Procedure Name: Intubation Date/Time: 05/24/2017 10:32 PM Performed by: Doreen Salvage, CRNA Pre-anesthesia Checklist: Patient identified, Patient being monitored, Timeout performed, Emergency Drugs available and Suction available Patient Re-evaluated:Patient Re-evaluated prior to induction Oxygen Delivery Method: Circle system utilized Preoxygenation: Pre-oxygenation with 100% oxygen Induction Type: IV induction and Cricoid Pressure applied Ventilation: Mask ventilation without difficulty Laryngoscope Size: Mac, McGraph and 4 Grade View: Grade I Tube type: Oral Tube size: 7.5 mm Number of attempts: 1 Airway Equipment and Method: Stylet Placement Confirmation: ETT inserted through vocal cords under direct vision,  positive ETCO2 and breath sounds checked- equal and bilateral Secured at: 21 cm Tube secured with: Tape Dental Injury: Teeth and Oropharynx as per pre-operative assessment

## 2017-05-24 NOTE — ED Notes (Signed)
Dr. Scotty CourtStafford notified in person of CT results

## 2017-05-24 NOTE — OR Nursing (Signed)
Dr. Earlene Plateravis states to hold 2200 Heparin dose. Floor aware

## 2017-05-24 NOTE — Op Note (Signed)
SURGICAL OPERATIVE REPORT  DATE OF PROCEDURE: 05/24/2017  ATTENDING Surgeon(s): Ancil Linseyavis, Jason Evan, MD  ANESTHESIA: GETA (General)  PRE-OPERATIVE DIAGNOSIS: Acute non-perforated appendicitis with localized peritonitis (K35.3)  POST-OPERATIVE DIAGNOSIS: Acute non-perforated appendicitis with localized peritonitis (K35.3)  PROCEDURE(S):  1.) Laparoscopic appendectomy (cpt: 44970)  INTRAOPERATIVE FINDINGS: Moderately inflamed non-perforated appendix without surrounding ascites  INTRAVENOUS FLUIDS: 500 mL crystalloid   ESTIMATED BLOOD LOSS: Minimal (<20 mL)  URINE OUTPUT: 100 mL   SPECIMENS: Appendix  IMPLANTS: None  DRAINS: None  COMPLICATIONS: None apparent  CONDITION AT END OF PROCEDURE: Hemodynamically stable and extubated  DISPOSITION OF PATIENT: PACU  INDICATIONS FOR PROCEDURE:  Patient is a 46 y.o. otherwise reportedly healthy male who presented with acute onset of epigastric abdominal pain that he reports began acutely and was worst 2 days ago (on Wednesday), since which it has persisted and progressed to become greatest at the Right lower quadrant. Patient denies any nausea, vomiting, change in bowel habits, fever/chills, CP, or SOB and reported the pain has been well-controlled in the Emergency Department. All risks, benefits, and alternatives to appendectomy were discussed with the patient, all of patient's questions were answered to his expressed satisfaction, and informed consent was obtained and documented.  DETAILS OF PROCEDURE: Patient was brought to the operating suite and appropriately identified. General anesthesia was administered along with confirmation of appropriate pre-operative antibiotics, and endotracheal intubation was performed by anesthetist, along with NG/OG tube for gastric decompression. In supine position, operative site was prepped and draped in usual sterile fashion, and following a brief time out, initial 5 mm incision was made in a natural  skin crease just below the umbilicus. Fascia was then elevated, and a Verress needle was inserted and its proper position confirmed using saline meniscus test prior to abdominal insufflation.  Upon insufflation of the abdominal cavity with carbon dioxide to a well-tolerated pressure of 12-15 mmHg, a 5 mm peri-umbilical port followed by laparoscope were inserted and used to inspect the abdominal cavity and its contents with no injuries from insertion of the first trochar noted. Two additional trocars were inserted, a 12 mm port at the Left lower quadrant position and another 5 mm port at the suprapubic position. The table was then placed in Trendelenburg position with the Right side up, and blunt graspers were gently used to retract the bowel overlying a clearly inflamed appendix surrounded by moderate peri-appendiceal inflammation and no appreciable ascites. The appendix was gently retracted by near its tip, and the base of the appendix and mesoappendix were identified in relation to the cecum. The mesoappendix was dissected from the visceral appendix and hemostasis achieved using a harmonic scalpel. Upon freeing the visceral appendix from the mesoappendix, an endostapler loaded with a standard blue tissue load was advanced across the base of the visceral appendix, which was compressed for several seconds, and the stapler was deployed and removed from the abdominal cavity. Hemostasis was confirmed, and the specimen was extracted from the abdominal cavity in a laparoscopic specimen bag.  The intraperitoneal cavity was inspected with no additional findings. PMI laparoscopic fascial closure device was then used to re-approximate fascia at the 12 mm Left lower quadrant port site. All ports were then removed under direct visualization, and the abdominal cavity was desuflated. All port sites were irrigated/cleaned, additional local anesthetic was injected at each incision, 3-0 Vicryl was used to re-approximate dermis  at 12 mm port site(s), and subcuticular 4-0 Monocryl suture was used to re-approximate skin. Skin was then cleaned, dried, and sterile  skin glue was applied. Patient was then safely able to be awakened, extubated, and transferred to PACU for post-operative monitoring and care.   I was present for all aspects of procedure, and there were no intra-operative complications apparent.

## 2017-05-24 NOTE — ED Notes (Signed)
Pt states he started having lower right abdominal pain Wednesday that has progressed. Pt rates pain at a 2 on a 1-10 scale at this time.

## 2017-05-24 NOTE — Discharge Instructions (Signed)
In addition to included general post-operative instructions for Laparoscopic Appendectomy,  Diet: Resume home heart healthy diet.   Activity: No heavy lifting >15 - 20 pounds (children, pets, laundry, garbage) or strenuous activity until follow-up, but light activity and walking are encouraged. Do not drive or drink alcohol if taking narcotic pain medications.  Wound care: 2 days after surgery (Sunday, 11/11), may shower/get incision wet with soapy water and pat dry (do not rub incisions), but no baths or submerging incision underwater until follow-up.   Medications: Resume all home medications. For mild to moderate pain: acetaminophen (Tylenol) or ibuprofen (if no kidney disease). Combining Tylenol with alcohol can substantially increase your risk of causing liver disease. Narcotic pain medications, if prescribed, can be used for severe pain, though may cause nausea, constipation, and drowsiness. Do not combine Tylenol and Percocet (or similar) within a 6 hour period as Percocet (and similar) contain(s) Tylenol. If you do not need the narcotic pain medication, you do not need to fill the prescription.  Call office [phone]) at any time if any questions, worsening pain, fevers/chills, bleeding, drainage from incision site, or other concerns.

## 2017-05-25 MED ORDER — ONDANSETRON HCL 4 MG/2ML IJ SOLN: 4.0000 mg | Freq: Four times a day (QID) | INTRAMUSCULAR | Status: DC | PRN

## 2017-05-25 MED ORDER — PIPERACILLIN-TAZOBACTAM 3.375 G IVPB 30 MIN: 3.3750 g | Freq: Three times a day (TID) | INTRAVENOUS | Status: DC

## 2017-05-25 MED ORDER — OXYCODONE-ACETAMINOPHEN 5-325 MG PO TABS
1.0000 | ORAL_TABLET | ORAL | Status: DC | PRN
  Administered 2017-05-25: 1 via ORAL
  Filled 2017-05-25: qty 1

## 2017-05-25 MED ORDER — OXYCODONE-ACETAMINOPHEN 5-325 MG PO TABS: 1.0000 | ORAL_TABLET | ORAL | 0 refills | Status: DC | PRN

## 2017-05-25 MED ORDER — KETOROLAC TROMETHAMINE 30 MG/ML IJ SOLN
30.0000 mg | Freq: Four times a day (QID) | INTRAMUSCULAR | Status: DC
  Administered 2017-05-25: 30 mg via INTRAVENOUS
  Filled 2017-05-25: qty 1

## 2017-05-25 MED ORDER — MORPHINE SULFATE (PF) 2 MG/ML IV SOLN: 2.0000 mg | INTRAVENOUS | Status: DC | PRN

## 2017-05-25 MED ORDER — PIPERACILLIN-TAZOBACTAM 3.375 G IVPB 30 MIN
3.3750 g | Freq: Three times a day (TID) | INTRAVENOUS | Status: AC
  Administered 2017-05-25: 3.375 g via INTRAVENOUS
  Filled 2017-05-25 (×2): qty 50

## 2017-05-25 MED ORDER — ONDANSETRON 4 MG PO TBDP: 4.0000 mg | ORAL_TABLET | Freq: Four times a day (QID) | ORAL | Status: DC | PRN

## 2017-05-25 NOTE — Anesthesia Postprocedure Evaluation (Signed)
Anesthesia Post Note  Patient: Patrick Barry  Procedure(s) Performed: APPENDECTOMY LAPAROSCOPIC (N/A Abdomen)  Patient location during evaluation: PACU Anesthesia Type: General Level of consciousness: awake and alert Pain management: pain level controlled Vital Signs Assessment: post-procedure vital signs reviewed and stable Respiratory status: spontaneous breathing, nonlabored ventilation, respiratory function stable and patient connected to nasal cannula oxygen Cardiovascular status: blood pressure returned to baseline and stable Postop Assessment: no apparent nausea or vomiting Anesthetic complications: no     Last Vitals:  Vitals:   05/25/17 0208 05/25/17 0608  BP: 127/89 103/73  Pulse: (!) 104 (!) 105  Resp: 20 20  Temp: 36.6 C 36.8 C  SpO2: 98% 98%    Last Pain:  Vitals:   05/25/17 0608  TempSrc: Oral  PainSc:                  Dabid Godown S

## 2017-05-25 NOTE — Discharge Summary (Signed)
Physician Discharge Summary  Patient ID: Patrick Barry MRN: [id number] DOB/AGE: September 29, 1970 46 y.o.  Admit date: 05/24/2017 Discharge date: 05/25/2017   Discharge Diagnoses:  Active Problems:   Acute appendicitis   Procedures: Laparoscopic appendectomy  Hospital Course: This patient status post lap scopic appendectomy for acute appendicitis which was nonruptured.  He is tolerating regular diet and feels well this morning.  He will be discharged today to follow-up in our office in 10 days he is reminded heavy lifting until seen in the office he may shower.  Is given oral analgesics for pain.  Medications  Consults: None  Disposition: Final discharge disposition not confirmed   Allergies as of 05/25/2017   No Known Allergies     Medication List    TAKE these medications   BLOOD GLUCOSE TEST STRIPS Strp Test tid   cholecalciferol 1000 units tablet Commonly known as:  VITAMIN D Take 1,000 Units daily by mouth.   EXCEDRIN MIGRAINE 250-250-65 MG tablet Generic drug:  aspirin-acetaminophen-caffeine Take 1 tablet every 6 (six) hours as needed by mouth.   Lancets Thin Misc Test tid   metFORMIN 1000 MG tablet Commonly known as:  GLUCOPHAGE Take 1 tablet (1,000 mg total) by mouth 2 (two) times daily with a meal.   oxyCODONE-acetaminophen 5-325 MG tablet Commonly known as:  PERCOCET/ROXICET Take 1-2 tablets every 4 (four) hours as needed by mouth for severe pain.   sildenafil 100 MG tablet Commonly known as:  VIAGRA Take 1 tablet (100 mg total) daily as needed by mouth for erectile dysfunction.      Follow-up Information    [facility] [place]. Schedule an appointment as soon as possible for a visit in 2 week(s).   Specialty:  General Surgery Contact information: [address] [place] [phone]          Lattie Hawichard E Vanassa Penniman, MD, FACS

## 2017-05-25 NOTE — Progress Notes (Signed)
1 Day Post-Op  Subjective: Status post laparoscopic appendectomy.  Nonruptured appendix.  Patient feels very well today no nausea vomiting fevers or chills.  Objective: Vital signs in last 24 hours: Temp:  [97.5 F (36.4 C)-98.5 F (36.9 C)] 98.3 F (36.8 C) (11/10 16100608) Pulse Rate:  [90-106] 105 (11/10 0608) Resp:  [16-20] 20 (11/10 96040608) BP: (98-135)/(70-102) 103/73 (11/10 0608) SpO2:  [97 %-100 %] 98 % (11/10 54090608) Weight:  [135 lb (61.2 kg)-139 lb (63 kg)] 139 lb (63 kg) (11/09 1604) Last BM Date: 05/24/17  Intake/Output from previous day: 11/09 0701 - 11/10 0700 In: 1375 [I.V.:1375] Out: 450 [Urine:450] Intake/Output this shift: No intake/output data recorded.  Physical exam:  Soft nontender abdomen wounds clean with Dermabond  Lab Results: CBC  Recent Labs    05/24/17 1201  WBC 6.5  HGB 15.2  HCT 45.7  PLT 337   BMET Recent Labs    05/24/17 1201  NA 137  K 4.3  CL 99*  CO2 27  GLUCOSE 171*  BUN 7  CREATININE 0.68  CALCIUM 9.1   PT/INR No results for input(s): LABPROT, INR in the last 72 hours. ABG No results for input(s): PHART, HCO3 in the last 72 hours.  Invalid input(s): PCO2, PO2  Studies/Results: Ct Abdomen Pelvis W Contrast  Addendum Date: 05/24/2017   ADDENDUM REPORT: 05/24/2017 13:47 ADDENDUM: These results were called by telephone at the time of interpretation on 05/24/2017 at 1:43 pm to Desha, charge nurse on behalf of Dr. Sharman CheekPHILLIP STAFFORD , who was not available to take my phone call. She verbally acknowledged these results. Electronically Signed   By: Ted Mcalpineobrinka  Dimitrova M.D.   On: 05/24/2017 13:47   Result Date: 05/24/2017 CLINICAL DATA:  Right lower quadrant pain, worsening over the last 3 days. EXAM: CT ABDOMEN AND PELVIS WITH CONTRAST TECHNIQUE: Multidetector CT imaging of the abdomen and pelvis was performed using the standard protocol following bolus administration of intravenous contrast. CONTRAST:  100mL ISOVUE-300 IOPAMIDOL  (ISOVUE-300) INJECTION 61% COMPARISON:  None. FINDINGS: Lower chest: No acute abnormality. Hepatobiliary: No focal liver abnormality is seen. No gallstones, gallbladder wall thickening, or biliary dilatation. Pancreas: Unremarkable. No pancreatic ductal dilatation or surrounding inflammatory changes. Spleen: Normal in size without focal abnormality. Adrenals/Urinary Tract: Adrenal glands are unremarkable. Kidneys are without renal calculi, focal lesion, or hydronephrosis. Benign 12 mm superior pole right renal cyst. Bladder is unremarkable. Stomach/Bowel: The appendix is fluid-filled dilated to 13 mm and with diffusely edematous walls. Mild periappendiceal fat stranding. No evidence of free fluid or abscess formation. No evidence of small-bowel obstruction. Colon is normal. Vascular/Lymphatic: No significant vascular findings are present. No enlarged abdominal or pelvic lymph nodes. Reproductive: Prostate is unremarkable. Other: No abdominal wall hernia or abnormality. No abdominopelvic ascites. Musculoskeletal: No acute or significant osseous findings. IMPRESSION: Acute appendicitis without CT evidence of rupture or abscess formation. No abnormalities within the solid abdominal organs. Electronically Signed: By: Ted Mcalpineobrinka  Dimitrova M.D. On: 05/24/2017 13:41    Anti-infectives: Anti-infectives (From admission, onward)   Start     Dose/Rate Route Frequency Ordered Stop   05/25/17 0600  piperacillin-tazobactam (ZOSYN) IVPB 3.375 g     3.375 g 12.5 mL/hr over 240 Minutes Intravenous Every 8 hours 05/25/17 0039 05/25/17 1359   05/25/17 0030  piperacillin-tazobactam (ZOSYN) IVPB 3.375 g  Status:  Discontinued     3.375 g 100 mL/hr over 30 Minutes Intravenous Every 8 hours 05/25/17 0030 05/25/17 0039   05/24/17 2200  piperacillin-tazobactam (ZOSYN) IVPB 3.375 g  Status:  Discontinued     3.375 g 12.5 mL/hr over 240 Minutes Intravenous Every 8 hours 05/24/17 1939 05/24/17 2333   05/24/17 1545   piperacillin-tazobactam (ZOSYN) IVPB 3.375 g     3.375 g 100 mL/hr over 30 Minutes Intravenous  Once 05/24/17 1543 05/24/17 1813   05/24/17 1445  piperacillin-tazobactam (ZOSYN) IVPB 3.375 g     3.375 g 100 mL/hr over 30 Minutes Intravenous  Once 05/24/17 1445 05/24/17 1625      Assessment/Plan: s/p Procedure(s): APPENDECTOMY LAPAROSCOPIC   Patient doing very well will advance diet and discharge later today.  Follow-up in 10 days  Lattie Hawichard E Cooper, MD, FACS  05/25/2017

## 2017-05-26 ENCOUNTER — Encounter: Payer: Self-pay | Admitting: Surgery

## 2017-05-28 LAB — SURGICAL PATHOLOGY

## 2017-06-10 ENCOUNTER — Other Ambulatory Visit: Payer: Self-pay

## 2017-06-12 ENCOUNTER — Telehealth: Payer: Self-pay | Admitting: Surgery

## 2017-06-12 ENCOUNTER — Encounter: Payer: Self-pay | Admitting: Surgery

## 2017-06-12 ENCOUNTER — Ambulatory Visit (INDEPENDENT_AMBULATORY_CARE_PROVIDER_SITE_OTHER): Payer: Managed Care, Other (non HMO) | Admitting: Surgery

## 2017-06-12 VITALS — BP 138/90 | HR 105 | Temp 97.9°F | Ht 71.0 in | Wt 140.8 lb

## 2017-06-12 DIAGNOSIS — K3580 Unspecified acute appendicitis: Secondary | ICD-10-CM

## 2017-06-12 NOTE — Progress Notes (Signed)
Outpatient postop visit  06/12/2017  Patrick Barry is an 46 y.o. male.    Procedure: Laparoscopic appendectomy  CC: No complaints  HPI: This is a patient status post laparoscopic appendectomy. Patient has no problems with fevers chills he is eating well having bowel movements. Medications reviewed.    Physical Exam:  There were no vitals taken for this visit.    PE:  Soft nondistended nontympanitic and nontender abdomen wounds are clean no erythema no drainage   Assessment/Plan:  This patient status post laparoscopic appendectomy.  Pathology is reviewed confirming the diagnosis and no malignancy.  Patient is doing very well recommend follow-up on an as-needed basis but because of the type of work he does he should not lift for another 2 weeks.  For he cannot return to work for 2 weeks  Lattie Hawichard E Flara Storti, MD, FACS

## 2017-06-12 NOTE — Telephone Encounter (Signed)
FMLA paperwork faxed to [facility] [employer]  @ [phone]. Mailed to [employer] @ [address] [place] [address].  Fax number invalid.   Patient notified of completed FMLA and his copy is placed up front.

## 2017-06-12 NOTE — Patient Instructions (Signed)
GENERAL POST-OPERATIVE PATIENT INSTRUCTIONS   WOUND CARE INSTRUCTIONS:  Keep a dry clean dressing on the wound if there is drainage. The initial bandage may be removed after 24 hours.  Once the wound has quit draining you may leave it open to air.  If clothing rubs against the wound or causes irritation and the wound is not draining you may cover it with a dry dressing during the daytime.  Try to keep the wound dry and avoid ointments on the wound unless directed to do so.  If the wound becomes bright red and painful or starts to drain infected material that is not clear, please contact your physician immediately.  If the wound is mildly pink and has a thick firm ridge underneath it, this is normal, and is referred to as a healing ridge.  This will resolve over the next 4-6 weeks.  BATHING: You may shower if you have been informed of this by your surgeon. However, Please do not submerge in a tub, hot tub, or pool until incisions are completely sealed or have been told by your surgeon that you may do so.  DIET:  You may eat any foods that you can tolerate.  It is a good idea to eat a high fiber diet and take in plenty of fluids to prevent constipation.  If you do become constipated you may want to take a mild laxative or take ducolax tablets on a daily basis until your bowel habits are regular.  Constipation can be very uncomfortable, along with straining, after recent surgery.  ACTIVITY:  You are encouraged to cough and deep breath or use your incentive spirometer if you were given one, every 15-30 minutes when awake.  This will help prevent respiratory complications and low grade fevers post-operatively if you had a general anesthetic.  You may want to hug a pillow when coughing and sneezing to add additional support to the surgical area, if you had abdominal or chest surgery, which will decrease pain during these times.  You are encouraged to walk and engage in light activity for the next two weeks.  You  should not lift more than 20 pounds, until 12/12///2018 as it could put you at increased risk for complications.  Twenty pounds is roughly equivalent to a plastic bag of groceries. At that time- Listen to your body when lifting, if you have pain when lifting, stop and then try again in a few days. Soreness after doing exercises or activities of daily living is normal as you get back in to your normal routine.  MEDICATIONS:  Try to take narcotic medications and anti-inflammatory medications, such as tylenol, ibuprofen, naprosyn, etc., with food.  This will minimize stomach upset from the medication.  Should you develop nausea and vomiting from the pain medication, or develop a rash, please discontinue the medication and contact your physician.  You should not drive, make important decisions, or operate machinery when taking narcotic pain medication.  SUNBLOCK Use sun block to incision area over the next year if this area will be exposed to sun. This helps decrease scarring and will allow you avoid a permanent darkened area over your incision.  QUESTIONS:  Please feel free to call our office if you have any questions, and we will be glad to assist you. [phone]    

## 2017-06-12 NOTE — Telephone Encounter (Signed)
Patient was seen today and dropped off disability paperwork, payment has been collected and patient would like a copy for himself. Please call patient when paperwork is ready for him to pick up.

## 2017-08-19 ENCOUNTER — Other Ambulatory Visit: Payer: Self-pay

## 2017-08-19 DIAGNOSIS — E119 Type 2 diabetes mellitus without complications: Secondary | ICD-10-CM

## 2017-08-19 LAB — POCT URINALYSIS DIPSTICK
Bilirubin, UA: NEGATIVE
Blood, UA: NEGATIVE
Glucose, UA: NEGATIVE
LEUKOCYTES UA: NEGATIVE
NITRITE UA: NEGATIVE
PROTEIN UA: NEGATIVE
Spec Grav, UA: 1.025 (ref 1.010–1.025)
Urobilinogen, UA: 0.2 E.U./dL
pH, UA: 5.5 (ref 5.0–8.0)

## 2017-08-20 LAB — HGB A1C W/O EAG: Hgb A1c MFr Bld: 6.7 % — ABNORMAL HIGH (ref 4.8–5.6)

## 2017-08-20 LAB — BASIC METABOLIC PANEL
BUN/Creatinine Ratio: 19 (ref 9–20)
BUN: 13 mg/dL (ref 6–24)
CHLORIDE: 100 mmol/L (ref 96–106)
CO2: 22 mmol/L (ref 20–29)
Calcium: 9.4 mg/dL (ref 8.7–10.2)
Creatinine, Ser: 0.67 mg/dL — ABNORMAL LOW (ref 0.76–1.27)
GFR, EST AFRICAN AMERICAN: 132 mL/min/{1.73_m2} (ref >59)
GFR, EST NON AFRICAN AMERICAN: 114 mL/min/{1.73_m2} (ref >59)
Glucose: 133 mg/dL — ABNORMAL HIGH (ref 65–99)
POTASSIUM: 4.6 mmol/L (ref 3.5–5.2)
SODIUM: 139 mmol/L (ref 134–144)

## 2017-09-16 ENCOUNTER — Encounter: Payer: Self-pay | Admitting: *Deleted

## 2017-09-16 ENCOUNTER — Encounter: Payer: Managed Care, Other (non HMO) | Attending: Internal Medicine | Admitting: *Deleted

## 2017-09-16 VITALS — BP 112/78 | Ht 71.0 in | Wt 144.1 lb

## 2017-09-16 DIAGNOSIS — Z6820 Body mass index (BMI) 20.0-20.9, adult: Secondary | ICD-10-CM | POA: Insufficient documentation

## 2017-09-16 DIAGNOSIS — Z713 Dietary counseling and surveillance: Secondary | ICD-10-CM | POA: Diagnosis not present

## 2017-09-16 DIAGNOSIS — E119 Type 2 diabetes mellitus without complications: Secondary | ICD-10-CM | POA: Diagnosis not present

## 2017-09-16 NOTE — Patient Instructions (Addendum)
Check blood sugars 1 x day before breakfast or  2 hrs after one meal - 3 to 4 x week Bring blood sugar records to the next appointment or class  Exercise: Begin walking  for   15 minutes   3  days a week and gradually increase to 150 minutes/week  Eat 3 meals day,  1-2  snacks a day Space meals 4-6 hours apart Don't skip meals  Make an eye doctor appointment  Call for an appointment with the dietitian or diabetes classes once you have your work schedule

## 2017-09-16 NOTE — Progress Notes (Signed)
Diabetes Self-Management Education  Visit Type: First/Initial  Appt. Start Time: 1035 Appt. End Time: 1135  09/16/2017  Patrick Barry, identified by name and date of birth, is a 47 y.o. male with a diagnosis of Diabetes: Type 2.   ASSESSMENT  Blood pressure 112/78, height 5\' 11"  (1.803 m), weight 144 lb 1.6 oz (65.4 kg). Body mass index is 20.1 kg/m.  Diabetes Self-Management Education - 09/16/17 1223      Visit Information   Visit Type  First/Initial      Initial Visit   Diabetes Type  Type 2    Are you currently following a meal plan?  No    Are you taking your medications as prescribed?  Yes    Date Diagnosed  June 2018      Health Coping   How would you rate your overall health?  Excellent;Good      Psychosocial Assessment   Patient Belief/Attitude about Diabetes  Motivated to manage diabetes    Self-care barriers  None    Self-management support  Doctor's office;Family    Patient Concerns  Nutrition/Meal planning;Medication;Monitoring;Healthy Lifestyle;Glycemic Control;Other (comment) gain weight    Special Needs  None    Preferred Learning Style  Auditory;Visual;Hands on    Learning Readiness  Change in progress    How often do you need to have someone help you when you read instructions, pamphlets, or other written materials from your doctor or pharmacy?  1 - Never    What is the last grade level you completed in school?  Assoc degree      Pre-Education Assessment   Patient understands the diabetes disease and treatment process.  Needs Instruction    Patient understands incorporating nutritional management into lifestyle.  Needs Instruction    Patient undertands incorporating physical activity into lifestyle.  Needs Instruction    Patient understands using medications safely.  Needs Instruction    Patient understands monitoring blood glucose, interpreting and using results  Needs Review    Patient understands prevention, detection, and treatment of acute  complications.  Needs Instruction    Patient understands prevention, detection, and treatment of chronic complications.  Needs Instruction    Patient understands how to develop strategies to address psychosocial issues.  Needs Instruction    Patient understands how to develop strategies to promote health/change behavior.  Needs Instruction      Complications   Last HgB A1C per patient/outside source  6.7 % 08/19/17    How often do you check your blood sugar?  3-4 times / week Pt is checking 3-5 x month. FBG's in Feb ranged from 130 mg/dL to 829181 mg/dL.     Fasting Blood glucose range (mg/dL)  562-130;[id number];180-200    Have you had a dilated eye exam in the past 12 months?  No    Have you had a dental exam in the past 12 months?  Yes    Are you checking your feet?  Yes    How many days per week are you checking your feet?  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 - may be fast foods, cheese sandwich, corn, potatoes, bread, pasta    Beverage(s)  Crystal Light, diet soda      Exercise   Exercise Type  ADL's      Patient Education   Previous Diabetes Education  No    Disease state   Definition of diabetes, type 1 and 2, and the diagnosis of diabetes;Factors that contribute to the development of diabetes    Nutrition management   Role of diet in the treatment of diabetes and the relationship between the three main macronutrients and blood glucose level;Reviewed blood glucose goals for pre and post meals and how to evaluate the patients' food intake on their blood glucose level.    Physical activity and exercise   Role of exercise on diabetes management, blood pressure control and cardiac health.    Medications  Reviewed patients medication for diabetes,  action, purpose, timing of dose and side effects.    Monitoring  Purpose and frequency of SMBG.;Taught/discussed recording of test results and interpretation of SMBG.;Identified appropriate SMBG and/or A1C goals.;Yearly dilated eye exam    Chronic complications  Relationship between chronic complications and blood glucose control    Psychosocial adjustment  Role of stress on diabetes;Identified and addressed patients feelings and concerns about diabetes      Individualized Goals (developed by patient)   Reducing Risk  Improve blood sugars Decrease medications Prevent diabetes complications Gain weight Lead a healthier lifestyle Become more fit     Outcomes   Expected Outcomes  Demonstrated interest in learning. Expect positive outcomes    Program Status  --       Individualized Plan for Diabetes Self-Management Training:   Learning Objective:  Patient will have a greater understanding of diabetes self-management. Patient education plan is to attend individual and/or group sessions per assessed needs and concerns.   Plan:   Patient Instructions  Check blood sugars 1 x day before breakfast or  2 hrs after one meal - 3 to 4 x week Bring blood sugar records to the next appointment or class Exercise: Begin walking  for   15 minutes   3  days a week and gradually increase to 150 minutes/week Eat 3 meals day,  1-2  snacks a day Space meals 4-6 hours apart Don't skip meals Make an eye doctor appointment Call for an appointment with the dietitian or diabetes classes once you have your work schedule   Expected Outcomes:  Demonstrated interest in learning. Expect positive outcomes  Education material provided:  General Meal Planning Guidelines Simple Meal Plan  If problems or questions, patient to contact team via:  Sharion Settler, RN, CCM, CDE [phone]  Future DSME appointment:  Patient to check his work calendar and call back to schedule Diabetes classes or 1:1 appointment  with the dietitian.

## 2017-10-18 ENCOUNTER — Encounter: Payer: Self-pay | Admitting: *Deleted

## 2017-11-27 ENCOUNTER — Ambulatory Visit: Payer: Self-pay | Admitting: Family Medicine

## 2017-11-27 VITALS — BP 123/90 | HR 92 | Temp 98.7°F | Resp 20 | Ht 71.0 in | Wt 150.0 lb

## 2017-11-27 DIAGNOSIS — Z0189 Encounter for other specified special examinations: Principal | ICD-10-CM

## 2017-11-27 DIAGNOSIS — E118 Type 2 diabetes mellitus with unspecified complications: Secondary | ICD-10-CM

## 2017-11-27 DIAGNOSIS — Z008 Encounter for other general examination: Secondary | ICD-10-CM

## 2017-11-27 NOTE — Progress Notes (Signed)
Subjective: Annual biometrics screening  Patient presents for his annual biometric screening. Patient reports a history of type 2 diabetes.  Patient reports he regularly sees Dr. Tedd Sias.  Patient reports eating a healthy, well-rounded diet and getting regular exercise. Patient is requesting tetanus booster, since he is unsure when his last one was.  Patient works for EMS. Patient denies any other issues or concerns.   Review of Systems Unremarkable  Objective  Physical Exam General: Awake, alert and oriented. No acute distress. Well developed, hydrated and nourished. Appears stated age.  HEENT: Supple neck without adenopathy. Sclera is non-icteric. The ear canal is clear without discharge. The tympanic membrane is normal in appearance with normal landmarks and cone of light. Nasal mucosa is pink and moist. Oral mucosa is pink and moist. The pharynx is normal in appearance without tonsillar swelling or exudates.  Skin: Skin in warm, dry and intact without rashes or lesions. Appropriate color for ethnicity. Cardiac: Heart rate and rhythm are normal. No murmurs, gallops, or rubs are auscultated.  Respiratory: The chest wall is symmetric and without deformity. No signs of respiratory distress. Lung sounds are clear in all lobes bilaterally without rales, ronchi, or wheezes.  Neurological: The patient is awake, alert and oriented to person, place, and time with normal speech.  Memory is normal and thought processes intact. No gait abnormalities are appreciated.  Psychiatric: Appropriate mood and affect.   Assessment Annual biometrics screening  Plan  Lipid panel and blood sugar pending for biometric screening, included in testing ordered by Dr. Tedd Sias. Encouraged routine visits with primary care provider.  Tetanus booster provided. Patient's heart rate was 106 just after having blood drawn.  When rechecked after the patient relaxed for a few minutes his heart rate was 92.  Patient reports this is  his normal resting heart rate. Encouraged patient to get regular exercise and eat a healthy, well-rounded diet.

## 2017-11-28 LAB — LIPID PANEL
CHOL/HDL RATIO: 4.1 ratio (ref 0.0–5.0)
Cholesterol, Total: 205 mg/dL — ABNORMAL HIGH (ref 100–199)
HDL: 50 mg/dL (ref >39)
LDL CALC: 140 mg/dL — AB (ref 0–99)
Triglycerides: 75 mg/dL (ref 0–149)
VLDL CHOLESTEROL CAL: 15 mg/dL (ref 5–40)

## 2017-11-28 LAB — COMPREHENSIVE METABOLIC PANEL
ALT: 24 IU/L (ref 0–44)
AST: 14 IU/L (ref 0–40)
Albumin/Globulin Ratio: 2 (ref 1.2–2.2)
Albumin: 4.5 g/dL (ref 3.5–5.5)
Alkaline Phosphatase: 65 IU/L (ref 39–117)
BUN/Creatinine Ratio: 12 (ref 9–20)
BUN: 9 mg/dL (ref 6–24)
Bilirubin Total: 0.3 mg/dL (ref 0.0–1.2)
CALCIUM: 9.7 mg/dL (ref 8.7–10.2)
CO2: 25 mmol/L (ref 20–29)
CREATININE: 0.76 mg/dL (ref 0.76–1.27)
Chloride: 100 mmol/L (ref 96–106)
GFR calc Af Amer: 126 mL/min/{1.73_m2} (ref >59)
GFR, EST NON AFRICAN AMERICAN: 109 mL/min/{1.73_m2} (ref >59)
Globulin, Total: 2.3 g/dL (ref 1.5–4.5)
Glucose: 149 mg/dL — ABNORMAL HIGH (ref 65–99)
Potassium: 4.8 mmol/L (ref 3.5–5.2)
Sodium: 141 mmol/L (ref 134–144)
TOTAL PROTEIN: 6.8 g/dL (ref 6.0–8.5)

## 2017-11-28 LAB — MICROALBUMIN / CREATININE URINE RATIO
Creatinine, Urine: 20.6 mg/dL
Microalbumin, Urine: <3 ug/mL

## 2017-11-28 LAB — VITAMIN D 25 HYDROXY (VIT D DEFICIENCY, FRACTURES): Vit D, 25-Hydroxy: 40.1 ng/mL (ref 30.0–100.0)

## 2017-11-28 LAB — HGB A1C W/O EAG: HEMOGLOBIN A1C: 6.8 % — AB (ref 4.8–5.6)

## 2018-06-11 ENCOUNTER — Ambulatory Visit: Payer: Self-pay

## 2018-06-11 ENCOUNTER — Ambulatory Visit: Payer: Self-pay | Admitting: Emergency Medicine

## 2018-06-11 VITALS — BP 122/82 | HR 101 | Temp 98.4°F | Resp 14 | Ht 71.0 in | Wt 150.0 lb

## 2018-06-11 DIAGNOSIS — Z0000 Encounter for general adult medical examination without abnormal findings: Secondary | ICD-10-CM

## 2018-06-11 DIAGNOSIS — J0100 Acute maxillary sinusitis, unspecified: Secondary | ICD-10-CM

## 2018-06-11 DIAGNOSIS — R590 Localized enlarged lymph nodes: Secondary | ICD-10-CM

## 2018-06-11 DIAGNOSIS — R739 Hyperglycemia, unspecified: Secondary | ICD-10-CM

## 2018-06-11 LAB — GLUCOSE, POCT (MANUAL RESULT ENTRY): POC Glucose: 156 mg/dl — AB (ref 70–99)

## 2018-06-11 MED ORDER — AMOXICILLIN 875 MG PO TABS: 875.0000 mg | ORAL_TABLET | Freq: Two times a day (BID) | ORAL | 0 refills | Status: AC

## 2018-06-11 MED ORDER — FLUTICASONE PROPIONATE 50 MCG/ACT NA SUSP: 2.0000 | Freq: Every day | NASAL | 6 refills | Status: AC

## 2018-06-11 NOTE — Patient Instructions (Signed)
Take medications as instructed. Please follow-up the lymph node in your neck with your primary care physician. Take antibiotics as instructed. Return to clinic if worsening of symptoms.     Lymphadenopathy Lymphadenopathy refers to swollen or enlarged lymph glands, also called lymph nodes. Lymph glands are part of your body's defense (immune) system, which protects the body from infections, germs, and diseases. Lymph glands are found in many locations in your body, including the neck, underarm, and groin. Many things can cause lymph glands to become enlarged. When your immune system responds to germs, such as viruses or bacteria, infection-fighting cells and fluid build up. This causes the glands to grow in size. Usually, this is not something to worry about. The swelling and any soreness often go away without treatment. However, swollen lymph glands can also be caused by a number of diseases. Your health care provider may do various tests to help determine the cause. If the cause of your swollen lymph glands cannot be found, it is important to monitor your condition to make sure the swelling goes away. Follow these instructions at home: Watch your condition for any changes. The following actions may help to lessen any discomfort you are feeling:  Get plenty of rest.  Take medicines only as directed by your health care provider. Your health care provider may recommend over-the-counter medicines for pain.  Apply moist heat compresses to the site of swollen lymph nodes as directed by your health care provider. This can help reduce any pain.  Check your lymph nodes daily for any changes.  Keep all follow-up visits as directed by your health care provider. This is important.  Contact a health care provider if:  Your lymph nodes are still swollen after 2 weeks.  Your swelling increases or spreads to other areas.  Your lymph nodes are hard, seem fixed to the skin, or are growing  rapidly.  Your skin over the lymph nodes is red and inflamed.  You have a fever.  You have chills.  You have fatigue.  You develop a sore throat.  You have abdominal pain.  You have weight loss.  You have night sweats. Get help right away if:  You notice fluid leaking from the area of the enlarged lymph node.  You have severe pain in any area of your body.  You have chest pain.  You have shortness of breath. This information is not intended to replace advice given to you by your health care provider. Make sure you discuss any questions you have with your health care provider. Document Released: 04/10/2008 Document Revised: 12/08/2015 Document Reviewed: 02/04/2014 Elsevier Interactive Patient Education  2018 [employer]. Sinusitis, Adult Sinusitis is soreness and inflammation of your sinuses. Sinuses are hollow spaces in the bones around your face. They are located:  Around your eyes.  In the middle of your forehead.  Behind your nose.  In your cheekbones.  Your sinuses and nasal passages are lined with a stringy fluid (mucus). Mucus normally drains out of your sinuses. When your nasal tissues get inflamed or swollen, the mucus can get trapped or blocked so air cannot flow through your sinuses. This lets bacteria, viruses, and funguses grow, and that leads to infection. Follow these instructions at home: Medicines  Take, use, or apply over-the-counter and prescription medicines only as told by your doctor. These may include nasal sprays.  If you were prescribed an antibiotic medicine, take it as told by your doctor. Do not stop taking the antibiotic even if  you start to feel better. Hydrate and Humidify  Drink enough water to keep your pee (urine) clear or pale yellow.  Use a cool mist humidifier to keep the humidity level in your home above 50%.  Breathe in steam for 10-15 minutes, 3-4 times a day or as told by your doctor. You can do this in the bathroom while  a hot shower is running.  Try not to spend time in cool or dry air. Rest  Rest as much as possible.  Sleep with your head raised (elevated).  Make sure to get enough sleep each night. General instructions  Put a warm, moist washcloth on your face 3-4 times a day or as told by your doctor. This will help with discomfort.  Wash your hands often with soap and water. If there is no soap and water, use hand sanitizer.  Do not smoke. Avoid being around people who are smoking (secondhand smoke).  Keep all follow-up visits as told by your doctor. This is important. Contact a doctor if:  You have a fever.  Your symptoms get worse.  Your symptoms do not get better within 10 days. Get help right away if:  You have a very bad headache.  You cannot stop throwing up (vomiting).  You have pain or swelling around your face or eyes.  You have trouble seeing.  You feel confused.  Your neck is stiff.  You have trouble breathing. This information is not intended to replace advice given to you by your health care provider. Make sure you discuss any questions you have with your health care provider. Document Released: 12/19/2007 Document Revised: 02/26/2016 Document Reviewed: 04/27/2015 Elsevier Interactive Patient Education  [employer]2018 Elsevier Inc.

## 2018-06-11 NOTE — Progress Notes (Signed)
Subjective. Patient started 2 weeks ago with an upper respiratory type infection.  He developed nasal congestion postnasal drip associated with a cough.  In general his cough has been nonproductive.  He has significant facial fullness.  In the morning he has a greenish nasal drainage but as the day goes on it becomes more clear.  Of note he is a diabetic on Glucophage.  He has taken over-the-counter medications without improvement.  In the past he has been treated for sinus infection with antibiotics and Flonase. Review of systems. Patient has diabetes currently on Glucophage.  He states his last hemoglobin A1c was about 7.  He does not check his sugars at home. Objective. Alert cooperative not ill-appearing. TM clear. Nose congested. Sinuses reveals mild tenderness especially on the left maxillary sinus. Throat no redness or exudate. Chest clear to auscultation no wheezes heard. Lymph nodes there is a 1/2 x 1/2 cm left lower posterior cervical node. Glucose 156 Assessment Patient presents with a 2-week history of head congestion and cough.  He has tenderness over the left maxillary sinus.  Of note he also had a 1/2 x 1/2 cm left lower posterior cervical node.  He is able to feel this node.  Sugar checked 2-1/2 hours after eating was 156. Plan. Amoxicillin 875 twice daily. Flonase. Check blood sugar. Please follow-up the lymph node in the left side of your neck with your primary care physician.  Check this area every week.  If there is enlargement please return immediately for reevaluation. He was given 1 day out of work.

## 2018-06-27 ENCOUNTER — Other Ambulatory Visit: Payer: Self-pay

## 2018-06-27 DIAGNOSIS — E119 Type 2 diabetes mellitus without complications: Secondary | ICD-10-CM

## 2018-06-27 DIAGNOSIS — E782 Mixed hyperlipidemia: Secondary | ICD-10-CM

## 2018-06-28 LAB — BASIC METABOLIC PANEL
BUN / CREAT RATIO: 14 (ref 9–20)
BUN: 10 mg/dL (ref 6–24)
CHLORIDE: 102 mmol/L (ref 96–106)
CO2: 24 mmol/L (ref 20–29)
Calcium: 9 mg/dL (ref 8.7–10.2)
Creatinine, Ser: 0.74 mg/dL — ABNORMAL LOW (ref 0.76–1.27)
GFR calc non Af Amer: 109 mL/min/{1.73_m2} (ref >59)
GFR, EST AFRICAN AMERICAN: 126 mL/min/{1.73_m2} (ref >59)
GLUCOSE: 150 mg/dL — AB (ref 65–99)
Potassium: 4.5 mmol/L (ref 3.5–5.2)
Sodium: 141 mmol/L (ref 134–144)

## 2018-06-28 LAB — LIPID PANEL WITH LDL/HDL RATIO
CHOLESTEROL TOTAL: 184 mg/dL (ref 100–199)
HDL: 42 mg/dL (ref >39)
LDL CALC: 131 mg/dL — AB (ref 0–99)
LDl/HDL Ratio: 3.1 ratio (ref 0.0–3.6)
Triglycerides: 56 mg/dL (ref 0–149)
VLDL CHOLESTEROL CAL: 11 mg/dL (ref 5–40)

## 2018-06-28 LAB — HGB A1C W/O EAG: HEMOGLOBIN A1C: 7 % — AB (ref 4.8–5.6)

## 2018-06-30 NOTE — Progress Notes (Signed)
Lab Results from 06/27/18 have been routed to the Ordering Provider Wendall MolaMelissa Solum MD 06/30/18 Erskine [employer]ib[employer]-Varnell Donate CMA

## 2018-12-16 ENCOUNTER — Encounter: Payer: Self-pay | Admitting: Adult Health

## 2018-12-16 ENCOUNTER — Encounter: Payer: Managed Care, Other (non HMO) | Admitting: Adult Health

## 2018-12-16 ENCOUNTER — Other Ambulatory Visit: Payer: Managed Care, Other (non HMO)

## 2018-12-16 ENCOUNTER — Ambulatory Visit: Payer: Managed Care, Other (non HMO) | Admitting: Adult Health

## 2018-12-16 ENCOUNTER — Other Ambulatory Visit: Payer: Self-pay

## 2018-12-16 VITALS — BP 118/78 | HR 95 | Temp 98.2°F | Resp 16 | Ht 71.0 in | Wt 152.0 lb

## 2018-12-16 DIAGNOSIS — Z0189 Encounter for other specified special examinations: Secondary | ICD-10-CM | POA: Diagnosis not present

## 2018-12-16 DIAGNOSIS — Z008 Encounter for other general examination: Secondary | ICD-10-CM

## 2018-12-16 DIAGNOSIS — E119 Type 2 diabetes mellitus without complications: Secondary | ICD-10-CM | POA: Diagnosis not present

## 2018-12-16 DIAGNOSIS — E782 Mixed hyperlipidemia: Secondary | ICD-10-CM

## 2018-12-16 NOTE — Patient Instructions (Signed)
Follow up with primary care as needed for chronic and maintenance health care- can be seen in this employee clinic for acute care.    I will have the office call you on your glucose and cholesterol results when they return if you have not heard within 1 week please call the office.  This biometric physical is a brief physical and the only labs done are glucose and your lipid panel(cholesterol) and is  not a substitute for seeing a primary care provider for a complete annual physical. Please see a primary care physician for routine health maintenance, labs and full physical at least yearly and follow up as recommended by your provider. Provider also recommends if you do not have a primary care provider for patient to establish care as soon as possible .Patient may chose provider of choice. Also gave the [facility]  PHYSICIAN/PROVIDER  REFERRAL LINE at 1-800-449- [phone] or web site at [email or web address] to help assist with finding a primary care doctor.  Patient verbalizes understanding that his office is acute care only and not a substitute for a primary care or for the management of chronic conditions.     Health Maintenance, Male A healthy lifestyle and preventive care is important for your health and wellness. Ask your health care provider about what schedule of regular examinations is right for you. What should I know about weight and diet? Eat a Healthy Diet  Eat plenty of vegetables, fruits, whole grains, low-fat dairy products, and lean protein.  Do not eat a lot of foods high in solid fats, added sugars, or salt.  Maintain a Healthy Weight Regular exercise can help you achieve or maintain a healthy weight. You should:  Do at least 150 minutes of exercise each week. The exercise should increase your heart rate and make you sweat (moderate-intensity exercise).  Do strength-training exercises at least twice a week. Watch Your Levels of Cholesterol and Blood Lipids  Have your blood tested  for lipids and cholesterol every 5 years starting at 49 years of age. If you are at high risk for heart disease, you should start having your blood tested when you are 49 years old. You may need to have your cholesterol levels checked more often if: ? Your lipid or cholesterol levels are high. ? You are older than 50 years of age. ? You are at high risk for heart disease. What should I know about cancer screening? Many types of cancers can be detected early and may often be prevented. Lung Cancer  You should be screened every year for lung cancer if: ? You are a current smoker who has smoked for at least 30 years. ? You are a former smoker who has quit within the past 15 years.  Talk to your health care provider about your screening options, when you should start screening, and how often you should be screened. Colorectal Cancer  Routine colorectal cancer screening usually begins at 50 years of age and should be repeated every 5-10 years until you are 49 years old. You may need to be screened more often if early forms of precancerous polyps or small growths are found. Your health care provider may recommend screening at an earlier age if you have risk factors for colon cancer.  Your health care provider may recommend using home test kits to check for hidden blood in the stool.  A small camera at the end of a tube can be used to examine your colon (sigmoidoscopy or colonoscopy). This checks   for the earliest forms of colorectal cancer. Prostate and Testicular Cancer  Depending on your age and overall health, your health care provider may do certain tests to screen for prostate and testicular cancer.  Talk to your health care provider about any symptoms or concerns you have about testicular or prostate cancer. Skin Cancer  Check your skin from head to toe regularly.  Tell your health care provider about any new moles or changes in moles, especially if: ? There is a change in a mole's size,  shape, or color. ? You have a mole that is larger than a pencil eraser.  Always use sunscreen. Apply sunscreen liberally and repeat throughout the day.  Protect yourself by wearing long sleeves, pants, a wide-brimmed hat, and sunglasses when outside. What should I know about heart disease, diabetes, and high blood pressure?  If you are 18-39 years of age, have your blood pressure checked every 3-5 years. If you are 40 years of age or older, have your blood pressure checked every year. You should have your blood pressure measured twice-once when you are at a hospital or clinic, and once when you are not at a hospital or clinic. Record the average of the two measurements. To check your blood pressure when you are not at a hospital or clinic, you can use: ? An automated blood pressure machine at a pharmacy. ? A home blood pressure monitor.  Talk to your health care provider about your target blood pressure.  If you are between 45-79 years old, ask your health care provider if you should take aspirin to prevent heart disease.  Have regular diabetes screenings by checking your fasting blood sugar level. ? If you are at a normal weight and have a low risk for diabetes, have this test once every three years after the age of 45. ? If you are overweight and have a high risk for diabetes, consider being tested at a younger age or more often.  A one-time screening for abdominal aortic aneurysm (AAA) by ultrasound is recommended for men aged 65-75 years who are current or former smokers. What should I know about preventing infection? Hepatitis B If you have a higher risk for hepatitis B, you should be screened for this virus. Talk with your health care provider to find out if you are at risk for hepatitis B infection. Hepatitis C Blood testing is recommended for:  Everyone born from 1945 through 1965.  Anyone with known risk factors for hepatitis C. Sexually Transmitted Diseases (STDs)  You  should be screened each year for STDs including gonorrhea and chlamydia if: ? You are sexually active and are younger than 49 years of age. ? You are older than 49 years of age and your health care provider tells you that you are at risk for this type of infection. ? Your sexual activity has changed since you were last screened and you are at an increased risk for chlamydia or gonorrhea. Ask your health care provider if you are at risk.  Talk with your health care provider about whether you are at high risk of being infected with HIV. Your health care provider may recommend a prescription medicine to help prevent HIV infection. What else can I do?  Schedule regular health, dental, and eye exams.  Stay current with your vaccines (immunizations).  Do not use any tobacco products, such as cigarettes, chewing tobacco, and e-cigarettes. If you need help quitting, ask your health care provider.  Limit alcohol intake to no   more than 2 drinks per day. One drink equals 12 ounces of beer, 5 ounces of wine, or 1 ounces of hard liquor.  Do not use street drugs.  Do not share needles.  Ask your health care provider for help if you need support or information about quitting drugs.  Tell your health care provider if you often feel depressed.  Tell your health care provider if you have ever been abused or do not feel safe at home. This information is not intended to replace advice given to you by your health care provider. Make sure you discuss any questions you have with your health care provider. Document Released: 12/29/2007 Document Revised: 02/29/2016 Document Reviewed: 04/05/2015 Elsevier Interactive Patient Education  2019 Elsevier Inc.  

## 2018-12-16 NOTE — Progress Notes (Signed)
[facility]  Patrick Barry DOB: 48 y.o. 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 He denies any concerns at today's office visit.  Patient  denies any fever, body aches,chills, rash, chest pain, shortness of breath, nausea, vomiting, or diarrhea.   Never smoker.  He exercises and try's to eat healthy.   Objective: Blood pressure 118/78, pulse 95, temperature 98.2 F (36.8 C), temperature source Oral, resp. rate 16, height 5\' 11"  (1.803 m), weight 152 lb (68.9 kg), SpO2 99 %. NAD HEENT: Within normal limits Neck: Normal. Supple thyroid normal  Heart: Regular rate and rhythm Lungs: Clear  Assessment: Biometric screen   Plan:  Follow up with primary care as needed for chronic and maintenance health care- can be seen in this employee clinic for acute care.   Fasting glucose and lipids. Discussed with patient that today's visit here is a limited biometric screening visit (not a comprehensive exam or management of any chronic problems) Discussed some health issues, including healthy eating habits and exercise. Encouraged to follow-up with PCP for annual comprehensive preventive and wellness care (and if applicable, any chronic issues). Questions invited and answered.    An After Visit Summary was printed and given to the patient.Reviewed with patient.   Patient verbalized understanding of all instructions given and denies any further questions at this time.

## 2018-12-17 LAB — LIPID PANEL WITH LDL/HDL RATIO
Cholesterol, Total: 135 mg/dL (ref 100–199)
HDL: 36 mg/dL — ABNORMAL LOW (ref >39)
LDL Calculated: 83 mg/dL (ref 0–99)
LDl/HDL Ratio: 2.3 ratio (ref 0.0–3.6)
Triglycerides: 80 mg/dL (ref 0–149)
VLDL Cholesterol Cal: 16 mg/dL (ref 5–40)

## 2018-12-17 LAB — COMPREHENSIVE METABOLIC PANEL
ALT: 15 IU/L (ref 0–44)
AST: 13 IU/L (ref 0–40)
Albumin/Globulin Ratio: 2.6 — ABNORMAL HIGH (ref 1.2–2.2)
Albumin: 4.9 g/dL (ref 4.0–5.0)
Alkaline Phosphatase: 67 IU/L (ref 39–117)
BUN/Creatinine Ratio: 16 (ref 9–20)
BUN: 13 mg/dL (ref 6–24)
Bilirubin Total: 0.3 mg/dL (ref 0.0–1.2)
CO2: 20 mmol/L (ref 20–29)
Calcium: 9.9 mg/dL (ref 8.7–10.2)
Chloride: 97 mmol/L (ref 96–106)
Creatinine, Ser: 0.79 mg/dL (ref 0.76–1.27)
GFR calc Af Amer: 123 mL/min/{1.73_m2} (ref >59)
GFR calc non Af Amer: 106 mL/min/{1.73_m2} (ref >59)
Globulin, Total: 1.9 g/dL (ref 1.5–4.5)
Glucose: 146 mg/dL — ABNORMAL HIGH (ref 65–99)
Potassium: 4.2 mmol/L (ref 3.5–5.2)
Sodium: 141 mmol/L (ref 134–144)
Total Protein: 6.8 g/dL (ref 6.0–8.5)

## 2018-12-17 LAB — MICROALBUMIN / CREATININE URINE RATIO
Creatinine, Urine: 75.7 mg/dL
Microalb/Creat Ratio: 8 mg/g creat (ref 0–29)
Microalbumin, Urine: 5.9 ug/mL

## 2018-12-17 LAB — HGB A1C W/O EAG: Hgb A1c MFr Bld: 6.8 % — ABNORMAL HIGH (ref 4.8–5.6)

## 2019-06-02 IMAGING — CT CT ABD-PELV W/ CM
2 of 5 series · 16 of 46 positions shown, 18 images · IV contrast (APPLIED)
Comparison: None.

ADDENDUM:
These results were called by telephone at the time of interpretation
on 05/24/2017 at [DATE] to Villani, Teo nurse on behalf of Dr.
MLY AHMED NIDAL , who was not available to take my phone call. She
verbally acknowledged these results.
CLINICAL DATA: Right lower quadrant pain, worsening over the last 3
days.

EXAM:
CT ABDOMEN AND PELVIS WITH CONTRAST
TECHNIQUE: Multidetector CT imaging of the abdomen and pelvis was performed
using the standard protocol following bolus administration of
intravenous contrast.
CONTRAST:  100mL QMKST7-NRR IOPAMIDOL (QMKST7-NRR) INJECTION 61%

[Series 2: axial st · axial · 0.66mm/px · z∈[-1064,-659]mm · 13 of 93 slices shown, 15 images]
[im 6/93  soft-tissue]
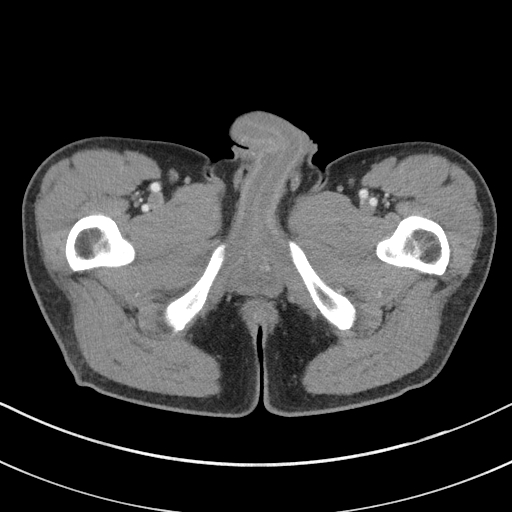
[im 6/93  bone]
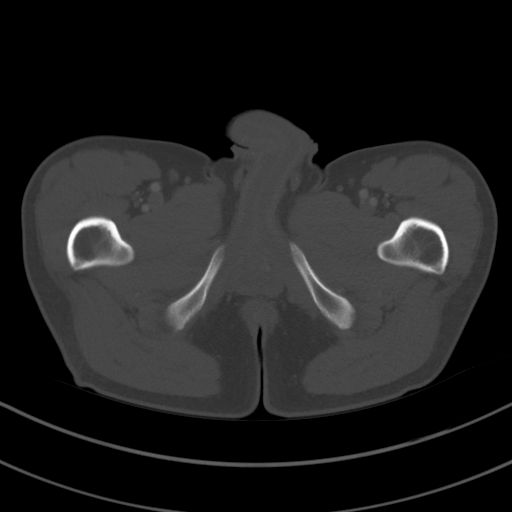
[im 11/93  soft-tissue]
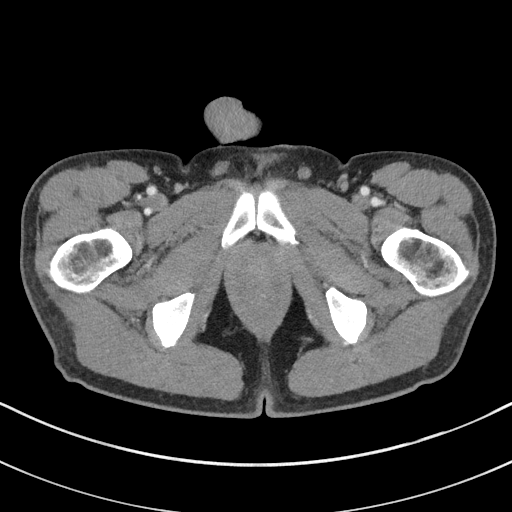
[im 21/93  soft-tissue]
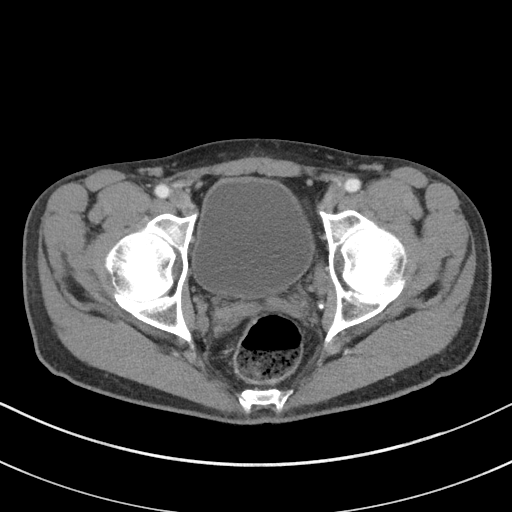
[im 26/93  soft-tissue]
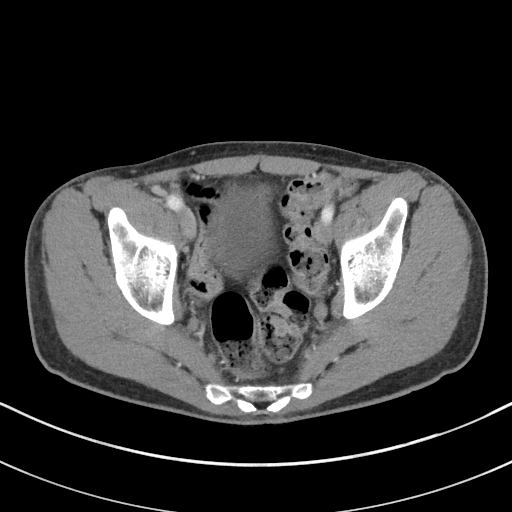
[im 31/93  soft-tissue]
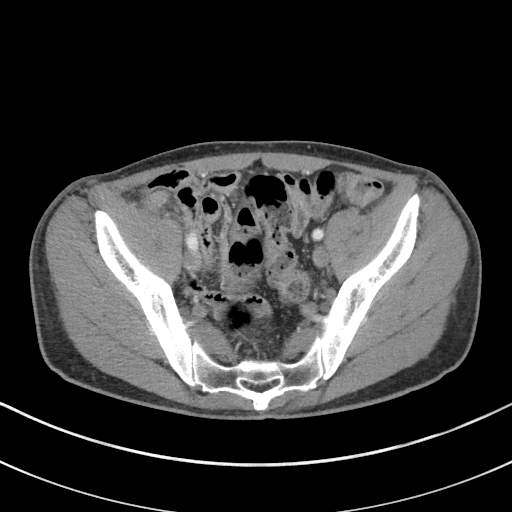
[im 41/93  soft-tissue]
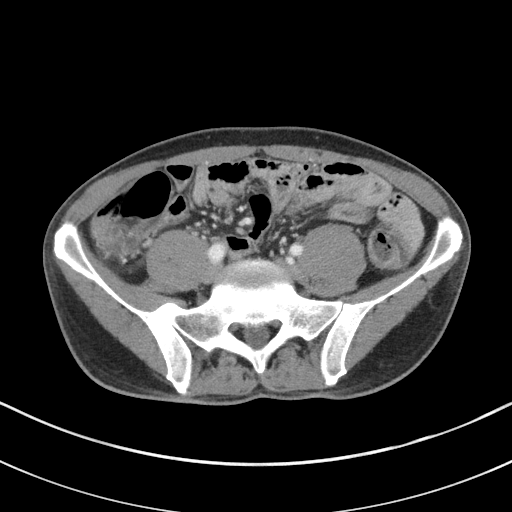
[im 47/93  soft-tissue]
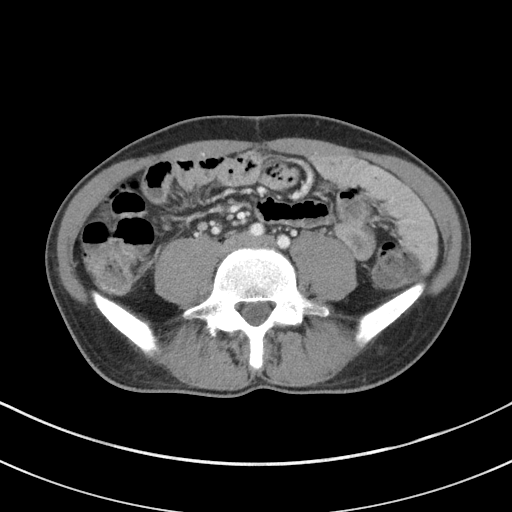
[im 52/93  soft-tissue]
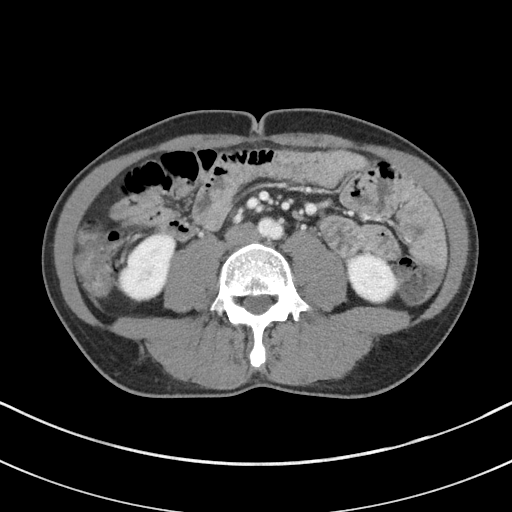
[im 62/93  soft-tissue]
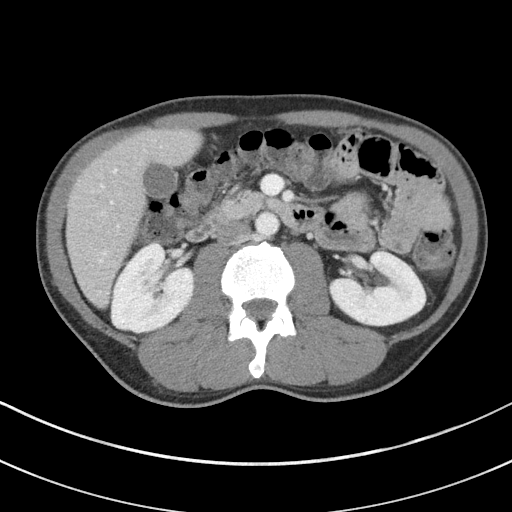
[im 62/93  bone]
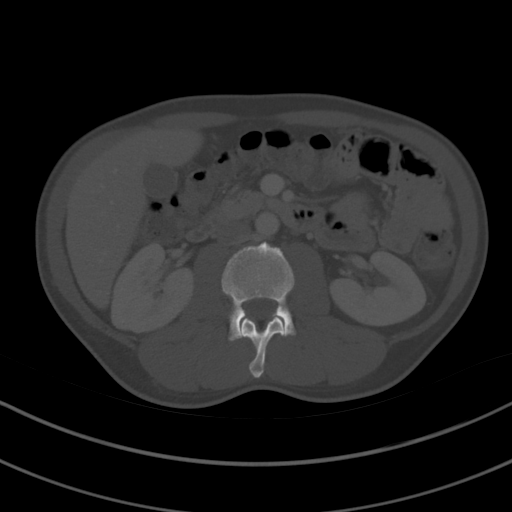
[im 67/93  soft-tissue]
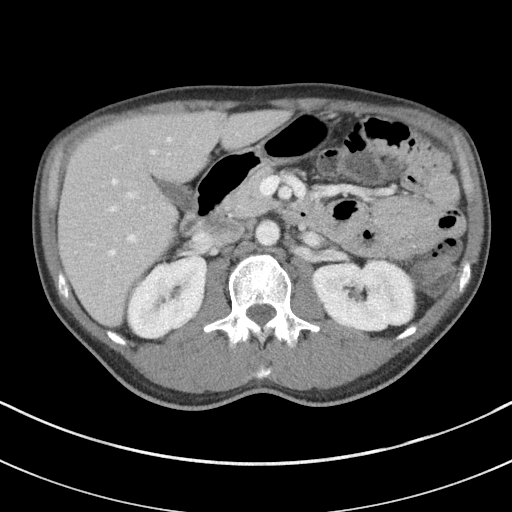
[im 72/93  soft-tissue]
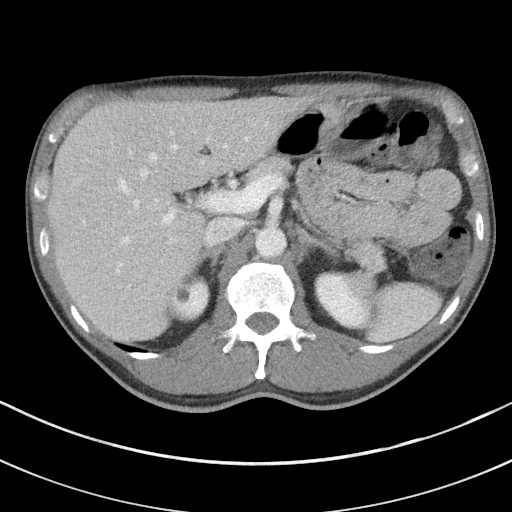
[im 82/93  soft-tissue]
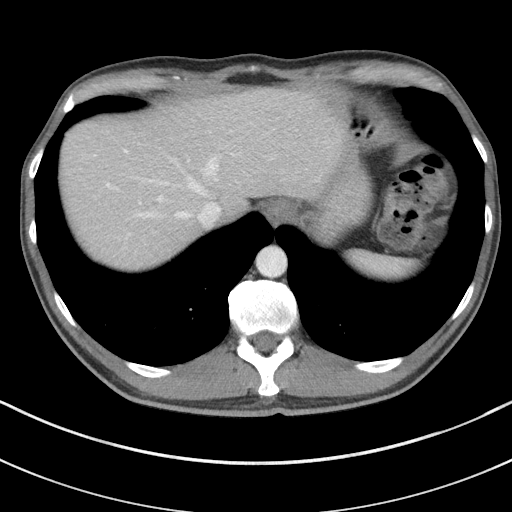
[im 87/93  soft-tissue]
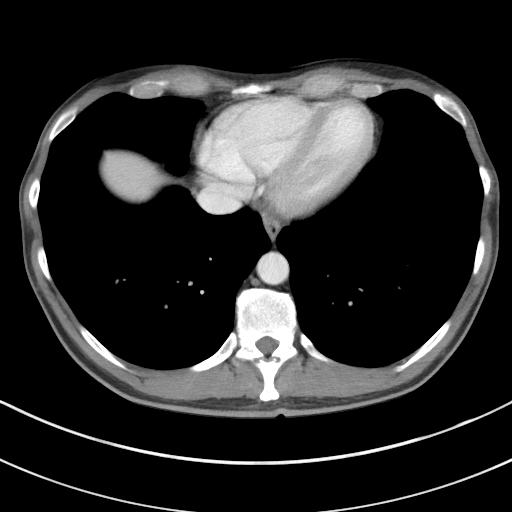

[Series 5: coronal st · coronal · 0.68mm/px · 3 of 75 slices shown]
[im 25/75  soft-tissue]
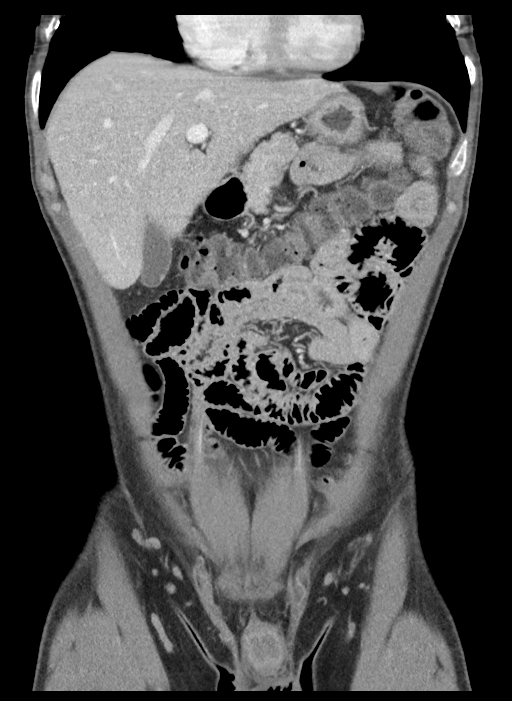
[im 33/75  soft-tissue]
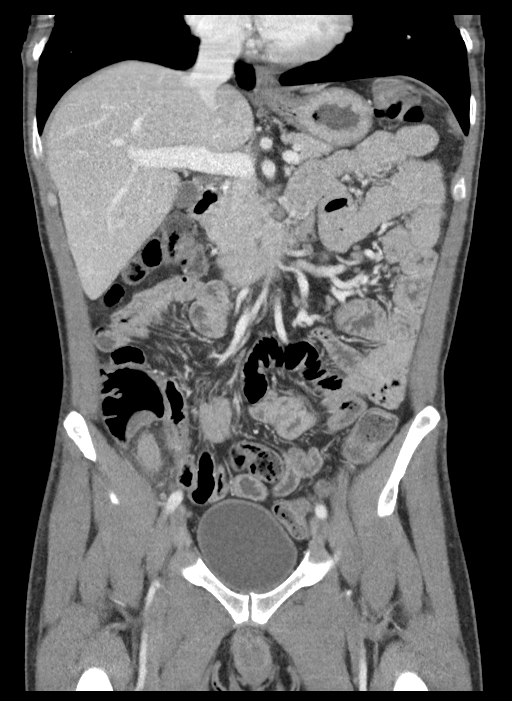
[im 42/75  soft-tissue]
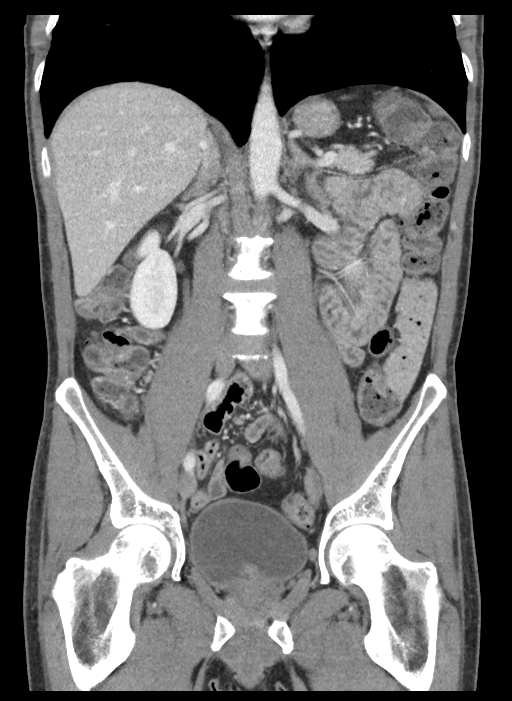

[16 of 46 positions shown; findings below may reference images not displayed]

FINDINGS: Lower chest: No acute abnormality.

Hepatobiliary: No focal liver abnormality is seen. No gallstones,
gallbladder wall thickening, or biliary dilatation.

Pancreas: Unremarkable. No pancreatic ductal dilatation or
surrounding inflammatory changes.

Spleen: Normal in size without focal abnormality.

Adrenals/Urinary Tract: Adrenal glands are unremarkable. Kidneys are
without renal calculi, focal lesion, or hydronephrosis. Benign 12 mm
superior pole right renal cyst. Bladder is unremarkable.

Stomach/Bowel: The appendix is fluid-filled dilated to 13 mm and
with diffusely edematous walls. Mild periappendiceal fat stranding.
No evidence of free fluid or abscess formation. No evidence of
small-bowel obstruction. Colon is normal.

Vascular/Lymphatic: No significant vascular findings are present. No
enlarged abdominal or pelvic lymph nodes.

Reproductive: Prostate is unremarkable.

Other: No abdominal wall hernia or abnormality. No abdominopelvic
ascites.

Musculoskeletal: No acute or significant osseous findings.
IMPRESSION: Acute appendicitis without CT evidence of rupture or abscess
formation.

No abnormalities within the solid abdominal organs.

## 2019-06-26 ENCOUNTER — Other Ambulatory Visit: Payer: Managed Care, Other (non HMO)

## 2019-06-26 ENCOUNTER — Other Ambulatory Visit: Payer: Self-pay

## 2019-06-26 DIAGNOSIS — E119 Type 2 diabetes mellitus without complications: Secondary | ICD-10-CM

## 2019-06-26 DIAGNOSIS — E559 Vitamin D deficiency, unspecified: Secondary | ICD-10-CM | POA: Diagnosis not present

## 2019-06-27 LAB — COMPREHENSIVE METABOLIC PANEL
ALT: 11 IU/L (ref 0–44)
AST: 10 IU/L (ref 0–40)
Albumin/Globulin Ratio: 1.8 (ref 1.2–2.2)
Albumin: 4.3 g/dL (ref 4.0–5.0)
Alkaline Phosphatase: 73 IU/L (ref 39–117)
BUN/Creatinine Ratio: 14 (ref 9–20)
BUN: 11 mg/dL (ref 6–24)
Bilirubin Total: 0.2 mg/dL (ref 0.0–1.2)
CO2: 22 mmol/L (ref 20–29)
Calcium: 9 mg/dL (ref 8.7–10.2)
Chloride: 102 mmol/L (ref 96–106)
Creatinine, Ser: 0.8 mg/dL (ref 0.76–1.27)
GFR calc Af Amer: 121 mL/min/{1.73_m2} (ref >59)
GFR calc non Af Amer: 105 mL/min/{1.73_m2} (ref >59)
Globulin, Total: 2.4 g/dL (ref 1.5–4.5)
Glucose: 220 mg/dL — ABNORMAL HIGH (ref 65–99)
Potassium: 4.4 mmol/L (ref 3.5–5.2)
Sodium: 139 mmol/L (ref 134–144)
Total Protein: 6.7 g/dL (ref 6.0–8.5)

## 2019-06-27 LAB — LIPID PANEL
Chol/HDL Ratio: 3.6 ratio (ref 0.0–5.0)
Cholesterol, Total: 125 mg/dL (ref 100–199)
HDL: 35 mg/dL — ABNORMAL LOW (ref >39)
LDL Chol Calc (NIH): 74 mg/dL (ref 0–99)
Triglycerides: 78 mg/dL (ref 0–149)
VLDL Cholesterol Cal: 16 mg/dL (ref 5–40)

## 2019-06-27 LAB — MICROALBUMIN / CREATININE URINE RATIO
Creatinine, Urine: 24.1 mg/dL
Microalb/Creat Ratio: <12 mg/g creat (ref 0–29)
Microalbumin, Urine: <3 ug/mL

## 2019-06-27 LAB — VITAMIN D 25 HYDROXY (VIT D DEFICIENCY, FRACTURES): Vit D, 25-Hydroxy: 39.3 ng/mL (ref 30.0–100.0)

## 2019-06-27 LAB — HGB A1C W/O EAG: Hgb A1c MFr Bld: 7.2 % — ABNORMAL HIGH (ref 4.8–5.6)

## 2019-12-23 ENCOUNTER — Encounter: Payer: Self-pay | Admitting: Physician Assistant

## 2019-12-23 ENCOUNTER — Other Ambulatory Visit: Payer: Self-pay

## 2019-12-23 ENCOUNTER — Ambulatory Visit: Payer: Managed Care, Other (non HMO) | Admitting: Physician Assistant

## 2019-12-23 VITALS — BP 136/84 | HR 87 | Resp 16 | Ht 71.0 in | Wt 159.0 lb

## 2019-12-23 DIAGNOSIS — E119 Type 2 diabetes mellitus without complications: Secondary | ICD-10-CM

## 2019-12-23 DIAGNOSIS — E782 Mixed hyperlipidemia: Secondary | ICD-10-CM

## 2019-12-23 NOTE — Progress Notes (Signed)
   Subjective:    Patient ID: Patrick Barry, male    DOB: 1970-08-19, 49 y.o.   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 He has tolerated new Rx very well. Morning sugars run in the 120-126 range as a rule. Endocrine is managed thru [facility] with Dr Tedd Sias  He does not have an established PCP  in past used this clinic for his basic care- protocols have changed and he is encouraged to locate and establish with PCP - there are designees in the [facility] and that was suggested as a possibility.as he is already a registered patient    Review of Systems Denies concerns   Non smoker Pravastatin Rx- weight and dietary management No active exercise program - " does busy things around the house" Seasonal allergies - flonase Objective:   Physical Exam Vitals reviewed.  Constitutional:      Appearance: Normal appearance.  HENT:     Head: Normocephalic and atraumatic.     Right Ear: Tympanic membrane, ear canal and external ear normal.     Left Ear: Tympanic membrane, ear canal and external ear normal.     Nose: Nose normal.     Mouth/Throat:     Mouth: Mucous membranes are moist.     Comments: DDS q 6 months Eyes:     Extraocular Movements: Extraocular movements intact.  Cardiovascular:     Rate and Rhythm: Normal rate and regular rhythm.     Pulses: Normal pulses.     Heart sounds: Normal heart sounds.  Pulmonary:     Effort: Pulmonary effort is normal.     Breath sounds: Normal breath sounds.  Abdominal:     General: Abdomen is flat.     Palpations: Abdomen is soft.  Genitourinary:    Comments: Deferred Denies concerns or symptoms c/w hernia Musculoskeletal:         General: Normal range of motion.     Cervical back: Normal range of motion and neck supple.     Comments: On/off table without assistance Toe walk, heel walk, squat and return without support   Skin:    General: Skin is warm and dry.     Capillary Refill: Capillary refill takes less than 2 seconds.  Neurological:     General: No focal deficit present.     Mental Status: He is alert.     Comments: CN 2-12 grossly WNL DTRS 2 + equal bilat  Psychiatric:        Mood and Affect: Mood normal.       Assessment & Plan:  Will report labs as available Encourage a brisk 30 minute walk daily for cardiovascular support, Stress control and diabetic management- slightly elevated respirations and heart rate, mantain consistent output to increase effectiveness  Reminder to establish with PCP Questions fielded , recommendations reviewed

## 2019-12-24 LAB — URINALYSIS, ROUTINE W REFLEX MICROSCOPIC
Bilirubin, UA: NEGATIVE
Ketones, UA: NEGATIVE
Leukocytes,UA: NEGATIVE
Nitrite, UA: NEGATIVE
Protein,UA: NEGATIVE
RBC, UA: NEGATIVE
Specific Gravity, UA: 1.026 (ref 1.005–1.030)
Urobilinogen, Ur: 0.2 mg/dL (ref 0.2–1.0)
pH, UA: 5 (ref 5.0–7.5)

## 2019-12-24 LAB — COMPREHENSIVE METABOLIC PANEL
ALT: 10 IU/L (ref 0–44)
AST: 11 IU/L (ref 0–40)
Albumin/Globulin Ratio: 2.1 (ref 1.2–2.2)
Albumin: 4.4 g/dL (ref 4.0–5.0)
Alkaline Phosphatase: 73 IU/L (ref 48–121)
BUN/Creatinine Ratio: 13 (ref 9–20)
BUN: 10 mg/dL (ref 6–24)
Bilirubin Total: 0.3 mg/dL (ref 0.0–1.2)
CO2: 24 mmol/L (ref 20–29)
Calcium: 9.1 mg/dL (ref 8.7–10.2)
Chloride: 103 mmol/L (ref 96–106)
Creatinine, Ser: 0.8 mg/dL (ref 0.76–1.27)
GFR calc Af Amer: 121 mL/min/{1.73_m2} (ref >59)
GFR calc non Af Amer: 105 mL/min/{1.73_m2} (ref >59)
Globulin, Total: 2.1 g/dL (ref 1.5–4.5)
Glucose: 119 mg/dL — ABNORMAL HIGH (ref 65–99)
Potassium: 4.5 mmol/L (ref 3.5–5.2)
Sodium: 140 mmol/L (ref 134–144)
Total Protein: 6.5 g/dL (ref 6.0–8.5)

## 2019-12-24 LAB — LIPID PANEL
Chol/HDL Ratio: 3.9 ratio (ref 0.0–5.0)
Cholesterol, Total: 152 mg/dL (ref 100–199)
HDL: 39 mg/dL — ABNORMAL LOW (ref >39)
LDL Chol Calc (NIH): 98 mg/dL (ref 0–99)
Triglycerides: 77 mg/dL (ref 0–149)
VLDL Cholesterol Cal: 15 mg/dL (ref 5–40)

## 2019-12-24 LAB — HGB A1C W/O EAG: Hgb A1c MFr Bld: 6.3 % — ABNORMAL HIGH (ref 4.8–5.6)

## 2020-05-17 ENCOUNTER — Ambulatory Visit: Payer: Managed Care, Other (non HMO)

## 2020-05-18 ENCOUNTER — Other Ambulatory Visit: Payer: Self-pay

## 2020-05-18 ENCOUNTER — Ambulatory Visit: Payer: Managed Care, Other (non HMO)

## 2020-05-18 DIAGNOSIS — Z23 Encounter for immunization: Secondary | ICD-10-CM

## 2020-06-28 ENCOUNTER — Other Ambulatory Visit: Payer: Managed Care, Other (non HMO)

## 2020-06-28 ENCOUNTER — Other Ambulatory Visit: Payer: Self-pay

## 2020-06-28 DIAGNOSIS — E119 Type 2 diabetes mellitus without complications: Secondary | ICD-10-CM | POA: Diagnosis not present

## 2020-06-29 LAB — COMPREHENSIVE METABOLIC PANEL
ALT: 17 IU/L (ref 0–44)
AST: 16 IU/L (ref 0–40)
Albumin/Globulin Ratio: 1.7 (ref 1.2–2.2)
Albumin: 4.4 g/dL (ref 4.0–5.0)
Alkaline Phosphatase: 77 IU/L (ref 44–121)
BUN/Creatinine Ratio: 14 (ref 9–20)
BUN: 12 mg/dL (ref 6–24)
Bilirubin Total: 0.3 mg/dL (ref 0.0–1.2)
CO2: 23 mmol/L (ref 20–29)
Calcium: 9.3 mg/dL (ref 8.7–10.2)
Chloride: 100 mmol/L (ref 96–106)
Creatinine, Ser: 0.84 mg/dL (ref 0.76–1.27)
GFR calc Af Amer: 118 mL/min/{1.73_m2} (ref >59)
GFR calc non Af Amer: 102 mL/min/{1.73_m2} (ref >59)
Globulin, Total: 2.6 g/dL (ref 1.5–4.5)
Glucose: 151 mg/dL — ABNORMAL HIGH (ref 65–99)
Potassium: 4.2 mmol/L (ref 3.5–5.2)
Sodium: 137 mmol/L (ref 134–144)
Total Protein: 7 g/dL (ref 6.0–8.5)

## 2020-06-29 LAB — MICROALBUMIN / CREATININE URINE RATIO
Creatinine, Urine: 100 mg/dL
Microalb/Creat Ratio: 10 mg/g creat (ref 0–29)
Microalbumin, Urine: 10.3 ug/mL

## 2020-06-29 LAB — HGB A1C W/O EAG: Hgb A1c MFr Bld: 7.2 % — ABNORMAL HIGH (ref 4.8–5.6)

## 2020-09-06 ENCOUNTER — Ambulatory Visit: Payer: Managed Care, Other (non HMO) | Admitting: Nurse Practitioner

## 2020-09-06 ENCOUNTER — Other Ambulatory Visit: Payer: Self-pay

## 2020-09-06 ENCOUNTER — Encounter: Payer: Self-pay | Admitting: Nurse Practitioner

## 2020-09-06 VITALS — BP 120/82 | Temp 98.4°F | Resp 18 | Ht 71.0 in | Wt 170.0 lb

## 2020-09-06 DIAGNOSIS — H8110 Benign paroxysmal vertigo, unspecified ear: Secondary | ICD-10-CM | POA: Diagnosis not present

## 2020-09-06 MED ORDER — MECLIZINE HCL 25 MG PO TABS
25.0000 mg | ORAL_TABLET | Freq: Two times a day (BID) | ORAL | 0 refills | Status: AC | PRN
Start: 2020-09-06 — End: ?

## 2020-09-06 NOTE — Progress Notes (Signed)
Subjective:     Patient ID: Patrick Barry, male   DOB: [birth year], 50 y.o.   MRN: [id number]  HPI Patrick Barry is a 50 y.o. male who presents to the [facility] with c/o dizziness. The symptoms started 4 days ago. Patient reports that he got out of bed Saturday morning and felt dizzy. He had mild nausea but no vomiting. He reports the symptoms reminded him of when he was a child and got car sick. He reports taking Dramamine and it helped. He states that he has continued to take the medication and the symptoms have continued to improve. Today the symptoms are minimal. He reports that certain quick movement with cause just a mild dizziness that resolves quickly. He is a diabetic but reports his blood sugars have not changed. His last A1c was a few months ago and was 7.2.   Past Medical History:  Diagnosis Date  . Diabetes mellitus without complication [facility])    Past Surgical History:  Procedure Laterality Date  . LAPAROSCOPIC APPENDECTOMY N/A 05/24/2017   Procedure: APPENDECTOMY LAPAROSCOPIC;  Surgeon: Lattie Haw, MD;  Location: [facility] ORS;  Service: General;  Laterality: N/A;   Current Outpatient Medications on File Prior to Visit  Medication Sig Dispense Refill  . acetaminophen (TYLENOL) 325 MG tablet Take 650 mg by mouth every 4 (four) hours as needed.     Marland Kitchen amoxicillin (AMOXIL) 875 MG tablet Take 1 tablet (875 mg total) by mouth 2 (two) times daily. (Patient not taking: Reported on 09/06/2020) 20 tablet 0  . aspirin-acetaminophen-caffeine (EXCEDRIN MIGRAINE) 250-250-65 MG tablet Take 1 tablet every 6 (six) hours as needed by mouth.    . cholecalciferol (VITAMIN D) 1000 units tablet Take 1,000 Units daily by mouth.    . fluticasone (FLONASE) 50 MCG/ACT nasal spray Place 2 sprays into both nostrils daily. 16 g 6  . Glucose Blood (BLOOD GLUCOSE TEST STRIPS) STRP Test tid 100 each 12  . Lancets Thin MISC Test tid 100 each 12  . metFORMIN (GLUCOPHAGE) 1000 MG tablet Take 1 tablet (1,000 mg  total) by mouth 2 (two) times daily with a meal. 180 tablet 3  . pravastatin (PRAVACHOL) 20 MG tablet Take 20 mg by mouth at bedtime.    Marland Kitchen PREVIDENT 5000 ENAMEL PROTECT 1.1-5 % PSTE BRUSH TEETH WITH PASTE FOR AT LEAST 1 MINUTE 2 3 TIMES A DAY    . sildenafil (VIAGRA) 100 MG tablet Take 1 tablet (100 mg total) daily as needed by mouth for erectile dysfunction. (Patient not taking: Reported on 09/06/2020) 10 tablet 6   No current facility-administered medications on file prior to visit.    No Known Allergies   Review of Systems  Constitutional: Negative for appetite change, chills and fever.  HENT: Negative.   Eyes: Negative for discharge, redness and visual disturbance.  Respiratory: Negative for cough, chest tightness and shortness of breath.   Cardiovascular: Negative for chest pain and palpitations.  Gastrointestinal: Negative for abdominal pain and vomiting. Nausea: 4 days ago but none now.  Skin: Negative for color change and rash.  Neurological: Positive for dizziness and light-headedness. Negative for syncope, speech difficulty, weakness and headaches.  Hematological: Negative for adenopathy.  Psychiatric/Behavioral: Negative for confusion.       Objective: BP 120/82   Temp 98.4 F (36.9 C) (Oral)   Resp 18   Ht 5\' 11"  (1.803 m)   Wt 170 lb (77.1 kg)   SpO2 98%   BMI 23.71 kg/m  Physical Exam Constitutional:      General: He is not in acute distress.    Appearance: Normal appearance.  HENT:     Head: Normocephalic and atraumatic.     Jaw: No trismus.     Right Ear: Tympanic membrane, ear canal and external ear normal.     Left Ear: Tympanic membrane, ear canal and external ear normal.     Ears:     Comments: Trace fluid right ear.     Nose: Nose normal.     Mouth/Throat:     Mouth: Mucous membranes are moist.     Pharynx: Oropharynx is clear.  Eyes:     General: Lids are normal. No visual field deficit.    Extraocular Movements: Extraocular movements  intact.     Right eye: No nystagmus.     Left eye: No nystagmus.     Conjunctiva/sclera: Conjunctivae normal.  Neck:     Vascular: Normal carotid pulses. No carotid bruit or JVD.  Cardiovascular:     Rate and Rhythm: Normal rate and regular rhythm.     Heart sounds: No murmur heard.   Pulmonary:     Effort: Pulmonary effort is normal.     Breath sounds: Normal breath sounds and air entry.  Musculoskeletal:     Cervical back: Neck supple.  Skin:    General: Skin is warm and dry.  Neurological:     General: No focal deficit present.     Mental Status: He is alert.     Cranial Nerves: Cranial nerves are intact.     Motor: No weakness.     Coordination: Finger-Nose-Finger Test normal.     Gait: Gait is intact.     Deep Tendon Reflexes:     Reflex Scores:      Bicep reflexes are 2+ on the right side and 2+ on the left side.      Brachioradialis reflexes are 2+ on the right side and 2+ on the left side.      Patellar reflexes are 2+ on the right side and 2+ on the left side.    Comments: Ambulatory with steady gait, stands on one foot without difficulty. Grips are equal, radial pulses 2+. Patient is not orthostatic  Psychiatric:        Mood and Affect: Mood normal.        Behavior: Behavior normal.       Assessment:     1. Benign paroxysmal positional vertigo, unspecified laterality - meclizine (ANTIVERT) 25 MG tablet; Take 1 tablet (25 mg total) by mouth 2 (two) times daily as needed for dizziness.  Dispense: 30 tablet; Refill: 0 Discussed with the patient clinical findings and plan of care. Patient given the opportunity to ask questions. All questioned fully answered. He will make a f/u appointment with his PCP in 2 weeks. He will return here as needed.

## 2020-09-06 NOTE — Patient Instructions (Signed)

## 2021-01-02 ENCOUNTER — Ambulatory Visit: Payer: Managed Care, Other (non HMO) | Admitting: Physician Assistant

## 2021-01-02 ENCOUNTER — Other Ambulatory Visit: Payer: Managed Care, Other (non HMO)

## 2021-01-02 VITALS — BP 136/90 | HR 80 | Temp 97.0°F | Resp 16 | Ht 71.0 in | Wt 168.0 lb

## 2021-01-02 DIAGNOSIS — E119 Type 2 diabetes mellitus without complications: Secondary | ICD-10-CM

## 2021-01-02 DIAGNOSIS — Z008 Encounter for other general examination: Secondary | ICD-10-CM | POA: Diagnosis not present

## 2021-01-02 DIAGNOSIS — Z0000 Encounter for general adult medical examination without abnormal findings: Secondary | ICD-10-CM | POA: Diagnosis not present

## 2021-01-02 NOTE — Progress Notes (Signed)
  Subjective:     Patient ID: Patrick Barry, male   DOB: [birth year], 50 y.o.   MRN: [id number]  Patrick Barry is a 50 y/o male employed with [employer]. 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 He is treated for DM for which he takes Metformin. He takes pravastatin for cholesterol control. He is active at work and around the house. He follows a low cholesterol/fat diet. He acknowledges stress related to his work, but finds control of stress by staying busy, doing yard work, and relaxing with a good TV show. He is seen by his Endocrinologist every 6 months, and also followed by his PCP.  He presents today for Biometric screen and exam.    Review of Systems  Constitutional: Negative.   HENT: Negative.    Eyes: Negative.   Respiratory: Negative.    Cardiovascular: Negative.   Gastrointestinal: Negative.   Endocrine: Negative.   Genitourinary: Negative.   Musculoskeletal: Negative.       Objective:   Physical Exam Vitals and nursing note reviewed.  Constitutional:      General: He is not in acute distress.    Appearance: He is well-developed.  HENT:     Head: Normocephalic and atraumatic.     Right Ear: External ear normal.     Left Ear: External ear normal.  Eyes:     General: No scleral icterus.       Right eye: No discharge.        Left eye: No discharge.     Conjunctiva/sclera: Conjunctivae normal.  Neck:     Trachea: No tracheal deviation.  Cardiovascular:     Rate and Rhythm: Normal rate.  Pulmonary:     Effort: Pulmonary effort is normal. No respiratory distress.     Breath sounds: No stridor.  Abdominal:     General: There is no distension.  Musculoskeletal:        General: No swelling or deformity.     Cervical back: Neck supple.  Skin:    General: Skin is warm and dry.     Findings: No rash.  Neurological:     Mental Status: He is alert.     Cranial Nerves: Cranial nerve deficit:  no gross deficits.       Assessment: Encounter for Biometric Exam         Plan: Today you had a Biometric exam and screen. Please continue to monitor your glucose and follow up with you specialist. Your lab test done today should be available in the next few days. Continue consistent exercise. Continue low fat diet. Monitor your cholesterol closely. Be mindful of burnout and becoming over stressed. Take time for self. "Me Time".  See your primary MD if any changes or concerns.

## 2021-01-02 NOTE — Patient Instructions (Signed)
Today you had a Biometric exam and screen. Please continue to monitor your glucose and follow up with you specialist. Your lab test done today should be available in the next few days. Continue consistent exercise. Continue low fat diet. Monitor your cholesterol closely. Be mindful of burnout and becoming over stressed. Take time for self. "Me Time". See your primary MD if any changes or concerns.

## 2021-01-03 LAB — BASIC METABOLIC PANEL
BUN/Creatinine Ratio: 11 (ref 9–20)
BUN: 10 mg/dL (ref 6–24)
CO2: 22 mmol/L (ref 20–29)
Calcium: 9.3 mg/dL (ref 8.7–10.2)
Chloride: 102 mmol/L (ref 96–106)
Creatinine, Ser: 0.9 mg/dL (ref 0.76–1.27)
Glucose: 129 mg/dL — ABNORMAL HIGH (ref 65–99)
Potassium: 5.2 mmol/L (ref 3.5–5.2)
Sodium: 139 mmol/L (ref 134–144)
eGFR: 104 mL/min/{1.73_m2} (ref >59)

## 2021-01-03 LAB — HGB A1C W/O EAG: Hgb A1c MFr Bld: 7.7 % — ABNORMAL HIGH (ref 4.8–5.6)

## 2021-01-03 LAB — LIPID PANEL
Chol/HDL Ratio: 3.9 ratio (ref 0.0–5.0)
Cholesterol, Total: 150 mg/dL (ref 100–199)
HDL: 38 mg/dL — ABNORMAL LOW (ref >39)
LDL Chol Calc (NIH): 97 mg/dL (ref 0–99)
Triglycerides: 79 mg/dL (ref 0–149)
VLDL Cholesterol Cal: 15 mg/dL (ref 5–40)
